# Patient Record
Sex: Male | Born: 1950 | Race: White | Hispanic: No | Marital: Married | Smoking: Current every day smoker
Health system: Southern US, Community
[De-identification: ages and names within clinical notes are randomized; demographics above are authoritative.]

## PROBLEM LIST (undated history)

## (undated) DIAGNOSIS — N529 Male erectile dysfunction, unspecified: Secondary | ICD-10-CM

## (undated) DIAGNOSIS — G2581 Restless legs syndrome: Secondary | ICD-10-CM

## (undated) DIAGNOSIS — F513 Sleepwalking [somnambulism]: Secondary | ICD-10-CM

## (undated) DIAGNOSIS — R319 Hematuria, unspecified: Secondary | ICD-10-CM

## (undated) DIAGNOSIS — G20 Parkinson's disease: Secondary | ICD-10-CM

## (undated) DIAGNOSIS — K219 Gastro-esophageal reflux disease without esophagitis: Secondary | ICD-10-CM

## (undated) DIAGNOSIS — F514 Sleep terrors [night terrors]: Secondary | ICD-10-CM

## (undated) DIAGNOSIS — G4730 Sleep apnea, unspecified: Secondary | ICD-10-CM

## (undated) DIAGNOSIS — G20C Parkinsonism, unspecified: Secondary | ICD-10-CM

## (undated) DIAGNOSIS — F329 Major depressive disorder, single episode, unspecified: Secondary | ICD-10-CM

## (undated) DIAGNOSIS — T8859XA Other complications of anesthesia, initial encounter: Secondary | ICD-10-CM

## (undated) DIAGNOSIS — T4145XA Adverse effect of unspecified anesthetic, initial encounter: Secondary | ICD-10-CM

## (undated) DIAGNOSIS — F3110 Bipolar disorder, current episode manic without psychotic features, unspecified: Secondary | ICD-10-CM

## (undated) DIAGNOSIS — F419 Anxiety disorder, unspecified: Secondary | ICD-10-CM

## (undated) DIAGNOSIS — M1990 Unspecified osteoarthritis, unspecified site: Secondary | ICD-10-CM

## (undated) DIAGNOSIS — F32A Depression, unspecified: Secondary | ICD-10-CM

## (undated) DIAGNOSIS — J449 Chronic obstructive pulmonary disease, unspecified: Secondary | ICD-10-CM

## (undated) DIAGNOSIS — B1920 Unspecified viral hepatitis C without hepatic coma: Secondary | ICD-10-CM

## (undated) HISTORY — DX: Unspecified viral hepatitis C without hepatic coma: B19.20

## (undated) HISTORY — DX: Bipolar disorder, current episode manic without psychotic features, unspecified: F31.10

## (undated) HISTORY — PX: COLONOSCOPY: SHX174

## (undated) HISTORY — DX: Anxiety disorder, unspecified: F41.9

## (undated) HISTORY — DX: Restless legs syndrome: G25.81

## (undated) HISTORY — DX: Sleep terrors (night terrors): F51.4

## (undated) HISTORY — DX: Parkinsonism, unspecified: G20.C

## (undated) HISTORY — DX: Male erectile dysfunction, unspecified: N52.9

## (undated) HISTORY — PX: LIVER BIOPSY: SHX301

## (undated) HISTORY — DX: Major depressive disorder, single episode, unspecified: F32.9

## (undated) HISTORY — DX: Depression, unspecified: F32.A

## (undated) HISTORY — PX: DEEP SHOULDER TUMOR EXCISION: SUR577

## (undated) HISTORY — PX: TONSILLECTOMY: SUR1361

## (undated) HISTORY — DX: Parkinson's disease: G20

## (undated) HISTORY — DX: Sleepwalking (somnambulism): F51.3

## (undated) HISTORY — DX: Sleep apnea, unspecified: G47.30

---

## 2011-03-25 DIAGNOSIS — F419 Anxiety disorder, unspecified: Secondary | ICD-10-CM

## 2011-03-25 DIAGNOSIS — N529 Male erectile dysfunction, unspecified: Secondary | ICD-10-CM

## 2011-03-25 DIAGNOSIS — B1920 Unspecified viral hepatitis C without hepatic coma: Secondary | ICD-10-CM

## 2011-03-25 HISTORY — DX: Anxiety disorder, unspecified: F41.9

## 2011-03-25 HISTORY — DX: Male erectile dysfunction, unspecified: N52.9

## 2011-03-25 HISTORY — DX: Unspecified viral hepatitis C without hepatic coma: B19.20

## 2011-03-25 LAB — BASIC METABOLIC PANEL: Glucose: 105 mg/dL

## 2011-03-25 LAB — CBC AND DIFFERENTIAL
HCT: 45 % (ref 41–53)
Hemoglobin: 15.3 g/dL (ref 13.5–17.5)

## 2011-03-25 LAB — HEPATIC FUNCTION PANEL
ALT: 73 U/L — AB (ref 10–40)
AST: 35 U/L (ref 14–40)
Bilirubin, Total: 0.1 mg/dL

## 2011-03-25 LAB — TSH: TSH: 0.9 u[IU]/mL (ref <=5.90)

## 2011-05-08 ENCOUNTER — Ambulatory Visit (INDEPENDENT_AMBULATORY_CARE_PROVIDER_SITE_OTHER): Payer: Self-pay | Admitting: Internal Medicine

## 2011-06-05 ENCOUNTER — Encounter (INDEPENDENT_AMBULATORY_CARE_PROVIDER_SITE_OTHER): Payer: Self-pay

## 2011-07-01 ENCOUNTER — Encounter (INDEPENDENT_AMBULATORY_CARE_PROVIDER_SITE_OTHER): Payer: Self-pay | Admitting: Internal Medicine

## 2011-07-01 ENCOUNTER — Ambulatory Visit (INDEPENDENT_AMBULATORY_CARE_PROVIDER_SITE_OTHER): Payer: 59 | Admitting: Internal Medicine

## 2011-07-01 DIAGNOSIS — F329 Major depressive disorder, single episode, unspecified: Secondary | ICD-10-CM

## 2011-07-01 DIAGNOSIS — B1920 Unspecified viral hepatitis C without hepatic coma: Secondary | ICD-10-CM

## 2011-07-01 DIAGNOSIS — F319 Bipolar disorder, unspecified: Secondary | ICD-10-CM

## 2011-07-01 LAB — AMMONIA: Ammonia: 18 umol/L (ref 11–60)
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
## 2011-07-02 ENCOUNTER — Telehealth (INDEPENDENT_AMBULATORY_CARE_PROVIDER_SITE_OTHER): Payer: Self-pay | Admitting: Internal Medicine

## 2011-07-02 LAB — PROTIME-INR: Prothrombin Time: 13.8 seconds (ref 11.6–15.2)

## 2011-07-03 LAB — HEPATITIS C RNA QUANTITATIVE: HCV Quantitative Log: 6.61 {Log} — ABNORMAL HIGH (ref <1.63)

## 2011-07-10 ENCOUNTER — Telehealth (INDEPENDENT_AMBULATORY_CARE_PROVIDER_SITE_OTHER): Payer: Self-pay | Admitting: Internal Medicine

## 2011-07-10 NOTE — Telephone Encounter (Signed)
I spoke with wife concerning Michael Fitzgerald.  They are going to discuss whether Michael Fitzgerald will be treated. If they decide they want  Hep C treatment they will let me know and we will make the referral to [facility].

## 2011-07-11 ENCOUNTER — Telehealth (INDEPENDENT_AMBULATORY_CARE_PROVIDER_SITE_OTHER): Payer: Self-pay | Admitting: Internal Medicine

## 2011-07-11 NOTE — Telephone Encounter (Signed)
Results given to patient. He came by the office today.  Would like a referral to [facility].  This   Has been addressed with Luster Landsberg. As of yet I do not have the genotype.  We have received records from Dr. Gwenevere Abbot at [place].

## 2011-07-11 NOTE — Telephone Encounter (Signed)
Patient needs referral to [facility] for Hepatitis C.

## 2011-07-11 NOTE — Telephone Encounter (Signed)
Referral faxed to [facility], their office will contact patient with appt

## 2011-07-11 NOTE — Telephone Encounter (Signed)
No answer at home #

## 2011-07-15 ENCOUNTER — Telehealth (INDEPENDENT_AMBULATORY_CARE_PROVIDER_SITE_OTHER): Payer: Self-pay | Admitting: Internal Medicine

## 2011-07-15 NOTE — Telephone Encounter (Signed)
Message left at home.hh

## 2011-07-17 NOTE — Telephone Encounter (Signed)
[name] , I called the lab, and they said that they had not rec'd the order for Hep C Genotype. It will need to be resent to them and the patient contacted to go and have blood drawn.

## 2011-07-18 ENCOUNTER — Other Ambulatory Visit (INDEPENDENT_AMBULATORY_CARE_PROVIDER_SITE_OTHER): Payer: Self-pay | Admitting: Internal Medicine

## 2011-07-18 NOTE — Telephone Encounter (Signed)
Spoke with Michael Fitzgerald this am. Apologized for genotype not being drawn.  (Lab says they did not get the order for genotype).  He will come by office in the morning to pick up slip for them to draw the blood.   [name]

## 2011-07-29 ENCOUNTER — Telehealth (INDEPENDENT_AMBULATORY_CARE_PROVIDER_SITE_OTHER): Payer: Self-pay | Admitting: Internal Medicine

## 2011-07-29 NOTE — Telephone Encounter (Signed)
Patient will be referred to [facility]

## 2011-11-22 ENCOUNTER — Telehealth (INDEPENDENT_AMBULATORY_CARE_PROVIDER_SITE_OTHER): Payer: Self-pay | Admitting: Internal Medicine

## 2012-01-09 ENCOUNTER — Telehealth (INDEPENDENT_AMBULATORY_CARE_PROVIDER_SITE_OTHER): Payer: Self-pay | Admitting: Internal Medicine

## 2012-01-09 NOTE — Telephone Encounter (Signed)
Close encounter 

## 2012-01-09 NOTE — Telephone Encounter (Signed)
none

## 2015-05-17 ENCOUNTER — Encounter (HOSPITAL_COMMUNITY): Payer: Self-pay

## 2015-05-17 ENCOUNTER — Encounter (HOSPITAL_COMMUNITY)
Admission: RE | Admit: 2015-05-17 | Discharge: 2015-05-17 | Disposition: A | Discharge status: Home / Self Care | Payer: Medicare PPO | Source: Ambulatory Visit | Attending: Otolaryngology | Admitting: Otolaryngology

## 2015-05-17 DIAGNOSIS — Z01812 Encounter for preprocedural laboratory examination: Secondary | ICD-10-CM | POA: Insufficient documentation

## 2015-05-17 DIAGNOSIS — K119 Disease of salivary gland, unspecified: Secondary | ICD-10-CM | POA: Diagnosis not present

## 2015-05-17 HISTORY — DX: Unspecified osteoarthritis, unspecified site: M19.90

## 2015-05-17 HISTORY — DX: Other complications of anesthesia, initial encounter: T88.59XA

## 2015-05-17 HISTORY — DX: Adverse effect of unspecified anesthetic, initial encounter: T41.45XA

## 2015-05-17 HISTORY — DX: Chronic obstructive pulmonary disease, unspecified: J44.9

## 2015-05-17 HISTORY — DX: Hematuria, unspecified: R31.9

## 2015-05-17 HISTORY — DX: Gastro-esophageal reflux disease without esophagitis: K21.9

## 2015-05-17 LAB — COMPREHENSIVE METABOLIC PANEL
ALK PHOS: 72 U/L (ref 38–126)
ALT: 12 U/L — ABNORMAL LOW (ref 17–63)
AST: 16 U/L (ref 15–41)
Albumin: 3.9 g/dL (ref 3.5–5.0)
Anion gap: 7 (ref 5–15)
BILIRUBIN TOTAL: 0.7 mg/dL (ref 0.3–1.2)
BUN: 9 mg/dL (ref 6–20)
CHLORIDE: 107 mmol/L (ref 101–111)
CO2: 25 mmol/L (ref 22–32)
Calcium: 9.2 mg/dL (ref 8.9–10.3)
Creatinine, Ser: 1.11 mg/dL (ref 0.61–1.24)
GFR calc Af Amer: >60 mL/min (ref >60)
Glucose, Bld: 96 mg/dL (ref 65–99)
Potassium: 4.3 mmol/L (ref 3.5–5.1)
Sodium: 139 mmol/L (ref 135–145)
Total Protein: 6.9 g/dL (ref 6.5–8.1)

## 2015-05-17 LAB — CBC
HEMATOCRIT: 45.2 % (ref 39.0–52.0)
Hemoglobin: 15.6 g/dL (ref 13.0–17.0)
MCH: 32.8 pg (ref 26.0–34.0)
MCHC: 34.5 g/dL (ref 30.0–36.0)
MCV: 95 fL (ref 78.0–100.0)
Platelets: 193 10*3/uL (ref 150–400)
RBC: 4.76 MIL/uL (ref 4.22–5.81)
RDW: 13.4 % (ref 11.5–15.5)
WBC: 7.2 10*3/uL (ref 4.0–10.5)

## 2015-05-17 LAB — PROTIME-INR
INR: 1.06 (ref 0.00–1.49)
PROTHROMBIN TIME: 14 s (ref 11.6–15.2)

## 2015-05-17 LAB — APTT: APTT: 29 s (ref 24–37)

## 2015-05-17 NOTE — Pre-Procedure Instructions (Addendum)
Michael Fitzgerald  05/17/2015     No Pharmacies Listed   Your procedure is scheduled on 05/25/15  Report to [facility] short stay admitting at 615 A.M.  Call this number if you have problems the morning of surgery:  [phone]   Remember:  Do not eat food or drink liquids after midnight.  Take these medicines the morning of surgery with A SIP OF WATER   xanax     STOP all herbel meds, nsaids (aleve,naproxen,advil,ibuprofen) 5 days prior to surgery starting 05/20/15 including vitamins,aspirin   Do not wear jewelry, make-up or nail polish.  Do not wear lotions, powders, or perfumes.  You may wear deodorant.  Do not shave 48 hours prior to surgery.  Men may shave face and neck.  Do not bring valuables to the hospital.  [facility] is not responsible for any belongings or valuables.  Contacts, dentures or bridgework may not be worn into surgery.  Leave your suitcase in the car.  After surgery it may be brought to your room.  For patients admitted to the hospital, discharge time will be determined by your treatment team.  Patients discharged the day of surgery will not be allowed to drive home.   Name and phone number of your driver:    Special instructions:   Special Instructions: [place] - Preparing for Surgery  Before surgery, you can play an important role.  Because skin is not sterile, your skin needs to be as free of germs as possible.  You can reduce the number of germs on you skin by washing with CHG (chlorahexidine gluconate) soap before surgery.  CHG is an antiseptic cleaner which kills germs and bonds with the skin to continue killing germs even after washing.  Please DO NOT use if you have an allergy to CHG or antibacterial soaps.  If your skin becomes reddened/irritated stop using the CHG and inform your nurse when you arrive at Short Stay.  Do not shave (including legs and underarms) for at least 48 hours prior to the first CHG shower.  You may shave your  face.  Please follow these instructions carefully:   1.  Shower with CHG Soap the night before surgery and the morning of Surgery.  2.  If you choose to wash your hair, wash your hair first as usual with your normal shampoo.  3.  After you shampoo, rinse your hair and body thoroughly to remove the Shampoo.  4.  Use CHG as you would any other liquid soap.  You can apply chg directly  to the skin and wash gently with scrungie or a clean washcloth.  5.  Apply the CHG Soap to your body ONLY FROM THE NECK DOWN.  Do not use on open wounds or open sores.  Avoid contact with your eyes ears, mouth and genitals (private parts).  Wash genitals (private parts)       with your normal soap.  6.  Wash thoroughly, paying special attention to the area where your surgery will be performed.  7.  Thoroughly rinse your body with warm water from the neck down.  8.  DO NOT shower/wash with your normal soap after using and rinsing off the CHG Soap.  9.  Pat yourself dry with a clean towel.            10.  Wear clean pajamas.            11.  Place clean sheets on your bed the  night of your first shower and do not sleep with pets.  Day of Surgery  Do not apply any lotions/deodorants the morning of surgery.  Please wear clean clothes to the hospital/surgery center.  Please read over the following fact sheets that you were given. Pain Booklet, Coughing and Deep Breathing and Surgical Site Infection Prevention

## 2015-05-18 ENCOUNTER — Other Ambulatory Visit (HOSPITAL_COMMUNITY): Payer: Self-pay | Admitting: Otolaryngology

## 2015-05-18 ENCOUNTER — Other Ambulatory Visit: Payer: Self-pay | Admitting: Otolaryngology

## 2015-05-23 ENCOUNTER — Ambulatory Visit: Payer: Self-pay | Admitting: Otolaryngology

## 2015-05-23 NOTE — H&P (Signed)
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 Discussed  This is a 64 year old male with two month history of left parotid mass. We discussed the significance of this and recommend surgical excision. Risks and beneftis of surgery discussed. All questions and concerns addressed. Consent obtained. We will schedule this at the main hospital. Patient agrees with the plan. Plan  I have interviewed and examined the patient and developed the proposed treatment plan.  Michael Fitzgerald H. Constance Holster, M.D. Reason For Visit  Michael Fitzgerald is here today at the kind request of Michael Fitzgerald for consultation and opinion for parotid mass. HPI  Patient is a 64 year old male who presents for left parotid mass. He states that he noticed a fullness along the left angle of the jaw about two months ago while shaving. It seems to be slowly increasing in size but otherwise is not bothering him. He had an ultrasound on 04/28/15 which showed a complex 3.1cm left parotid masss. Denies fever, pain, difficulty swallowing, difficulty breathing, facial weakness/numbness or ear pain.  Patient has sleep apnea. Uses Albuterol inhaler a few times per week if he is working outdoors but does not have a formal diagnosis of asthma or emphysema/COPD (has never seen a pulmonary specialist). No history of skin cancer of the head/neck. Surgeries include tonsillectomy. He also had a benign tumor removed from the right upper back. He is a current smoker; less than one pack per day. He is retired from [employer] work. Wears hearing aids, managed by an outside provider. Allergies  Rec: 20Jun2016.  List Reconciled and Reviewed. Latex predniSONE Amended : Michael Sizer  LPN; 16/08/9603 5:40 PM EST. Current Meds  ALPRAZolam 1 MG Oral Tablet;; RPT. Active Problems  Obstructive sleep apnea   (327.23) (G47.33). PMH  History of hepatitis (V12.09) (Z86.19). PSH  Shoulder Surgery; growth on shoulder Tonsillectomy. Family Hx   Seizure: Mother (R56.9). Personal Hx  Current smoker (305.1) (F17.200) No alcohol use No caffeine use. ROS  Systemic: Feeling tired (fatigue).  No fever  and no night sweats.  Recent weight loss. Head: Headache. Eyes: No eye symptoms. Otolaryngeal: Hearing loss.  No earache.  Tinnitus.  No purulent nasal discharge.  No nasal passage blockage (stuffiness).  Snoring.  No sneezing, no hoarseness, and no sore throat. Cardiovascular: No chest pain or discomfort  and no palpitations. Pulmonary: Dyspnea  and cough.  No wheezing. Gastrointestinal: No dysphagia.  Heartburn.  No nausea, no abdominal pain, and no melena.  No diarrhea. Genitourinary: No dysuria. Endocrine: No muscle weakness. Musculoskeletal: Calf muscle cramps  and arthralgias.  No soft tissue swelling. Neurological: Dizziness.  No fainting.  Tingling.  No numbness. Psychological: Anxiety  and depression. Skin: No rash. 12 system ROS was obtained and reviewed on the Health Maintenance form dated today.  Positive responses are shown above.  If the symptom is not checked, the patient has denied it. Vital Signs   Recorded by Michael Fitzgerald on 08 May 2015 12:55 PM BP:120/80,  BSA Calculated: 1.96 ,  BMI Calculated: 27.67 ,  Weight: 182 lb , BMI: 27.7 kg/m2,  Height: 68 in. Physical Exam  APPEARANCE: Well developed, well nourished, in no acute distress.  Normal affect, in a pleasant mood.  Oriented to time, place and person. COMMUNICATION: Normal voice   HEAD & FACE:  No scars, lesions or masses of head and face.  Sinuses nontender to palpation.  Left parotid gland with 3cm rubbery well-circumscribed mass, non-tender.  Facial strength symmetric.  No facial weakness or paralysis. EYES: EOMI with normal primary gaze alignment. Visual acuity grossly intact.  PERRLA EXTERNAL EAR & NOSE: No scars, lesions or masses  EAC & TYMPANIC MEMBRANE:  EAC shows no obstructing lesions or debris and tympanic membranes are normal bilaterally.  No middle ear effusion, infection or cholesteatoma. GROSS HEARING: Mildly impaired without use of hearing aids. TMJ:  Nontender  INTRANASAL EXAM: No polyps or purulence.  NASOPHARYNX: Normal, without lesions. LIPS, TEETH & GUMS: No lip lesions, normal dentition and normal gums. ORAL CAVITY/OROPHARYNX:  Oral mucosa moist without lesion or asymmetry of the palate, tongue or posterior pharynx. Right tonsil 2+, left tonsil surgically absent. NECK:  Supple without adenopathy or mass. THYROID:  Normal with no masses palpable.  NEUROLOGIC:  No gross CN deficits. No nystagmus noted.   LYMPHATIC:  No enlarged nodes palpable. Signature  Electronically signed by : Jolene Provost  PA-C; 05/08/2015 1:29 PM EST. Electronically signed by : Izora Gala  M.D.; 05/08/2015 1:32 PM EST. Electronically signed by : Izora Gala  M.D.; 05/08/2015 1:36 PM EST.

## 2015-05-24 MED ORDER — CEFAZOLIN SODIUM-DEXTROSE 2-3 GM-% IV SOLR
2.0000 g | INTRAVENOUS | Status: AC
  Administered 2015-05-25: 2 g via INTRAVENOUS
  Filled 2015-05-24: qty 50

## 2015-05-25 ENCOUNTER — Encounter (HOSPITAL_COMMUNITY)
Admission: RE | Disposition: A | Discharge status: Home / Self Care | Payer: Self-pay | Source: Ambulatory Visit | Attending: Otolaryngology

## 2015-05-25 ENCOUNTER — Observation Stay (HOSPITAL_COMMUNITY)
Admission: RE | Admit: 2015-05-25 | Discharge: 2015-05-26 | Disposition: A | Discharge status: Home / Self Care | Payer: Medicare PPO | Source: Ambulatory Visit | Attending: Otolaryngology | Admitting: Otolaryngology

## 2015-05-25 ENCOUNTER — Encounter (HOSPITAL_COMMUNITY): Payer: Self-pay | Admitting: *Deleted

## 2015-05-25 ENCOUNTER — Ambulatory Visit (HOSPITAL_COMMUNITY): Payer: Medicare PPO | Admitting: Anesthesiology

## 2015-05-25 DIAGNOSIS — K219 Gastro-esophageal reflux disease without esophagitis: Secondary | ICD-10-CM | POA: Diagnosis not present

## 2015-05-25 DIAGNOSIS — R221 Localized swelling, mass and lump, neck: Secondary | ICD-10-CM | POA: Diagnosis present

## 2015-05-25 DIAGNOSIS — J449 Chronic obstructive pulmonary disease, unspecified: Secondary | ICD-10-CM | POA: Insufficient documentation

## 2015-05-25 DIAGNOSIS — F17210 Nicotine dependence, cigarettes, uncomplicated: Secondary | ICD-10-CM | POA: Diagnosis not present

## 2015-05-25 DIAGNOSIS — Z91040 Latex allergy status: Secondary | ICD-10-CM | POA: Insufficient documentation

## 2015-05-25 DIAGNOSIS — D110 Benign neoplasm of parotid gland: Principal | ICD-10-CM | POA: Insufficient documentation

## 2015-05-25 DIAGNOSIS — G4730 Sleep apnea, unspecified: Secondary | ICD-10-CM | POA: Insufficient documentation

## 2015-05-25 DIAGNOSIS — Z888 Allergy status to other drugs, medicaments and biological substances status: Secondary | ICD-10-CM | POA: Diagnosis not present

## 2015-05-25 DIAGNOSIS — K118 Other diseases of salivary glands: Secondary | ICD-10-CM | POA: Diagnosis present

## 2015-05-25 HISTORY — PX: PAROTIDECTOMY: SUR1003

## 2015-05-25 HISTORY — PX: PAROTIDECTOMY: SHX2163

## 2015-05-25 SURGERY — EXCISION, PAROTID GLAND
Anesthesia: General | Site: Neck | Laterality: Left

## 2015-05-25 MED ORDER — ALBUTEROL SULFATE HFA 108 (90 BASE) MCG/ACT IN AERS: 1.0000 | INHALATION_SPRAY | Freq: Four times a day (QID) | RESPIRATORY_TRACT | Status: DC | PRN

## 2015-05-25 MED ORDER — PHENYLEPHRINE HCL 10 MG/ML IJ SOLN
INTRAMUSCULAR | Status: DC | PRN
  Administered 2015-05-25: 80 ug via INTRAVENOUS

## 2015-05-25 MED ORDER — HYDROMORPHONE HCL 1 MG/ML IJ SOLN
0.2500 mg | INTRAMUSCULAR | Status: DC | PRN
  Administered 2015-05-25: 0.5 mg via INTRAVENOUS

## 2015-05-25 MED ORDER — FENTANYL CITRATE (PF) 250 MCG/5ML IJ SOLN
INTRAMUSCULAR | Status: AC
  Filled 2015-05-25: qty 5

## 2015-05-25 MED ORDER — DEXTROSE-NACL 5-0.9 % IV SOLN
INTRAVENOUS | Status: DC
  Administered 2015-05-25: 12:00:00 via INTRAVENOUS

## 2015-05-25 MED ORDER — HYDROCODONE-ACETAMINOPHEN 7.5-325 MG PO TABS: 1.0000 | ORAL_TABLET | Freq: Four times a day (QID) | ORAL | Status: DC | PRN

## 2015-05-25 MED ORDER — LACTATED RINGERS IV SOLN
INTRAVENOUS | Status: DC
  Administered 2015-05-25 (×2): via INTRAVENOUS

## 2015-05-25 MED ORDER — BACITRACIN ZINC 500 UNIT/GM EX OINT
TOPICAL_OINTMENT | CUTANEOUS | Status: AC
  Filled 2015-05-25: qty 28.35

## 2015-05-25 MED ORDER — PROMETHAZINE HCL 25 MG PO TABS: 25.0000 mg | ORAL_TABLET | Freq: Four times a day (QID) | ORAL | Status: DC | PRN

## 2015-05-25 MED ORDER — 0.9 % SODIUM CHLORIDE (POUR BTL) OPTIME
TOPICAL | Status: DC | PRN
  Administered 2015-05-25: 1000 mL

## 2015-05-25 MED ORDER — POLYVINYL ALCOHOL 1.4 % OP SOLN
1.0000 [drp] | OPHTHALMIC | Status: DC
  Administered 2015-05-25 – 2015-05-26 (×8): 1 [drp] via OPHTHALMIC
  Filled 2015-05-25: qty 15

## 2015-05-25 MED ORDER — ARTIFICIAL TEARS OP OINT
TOPICAL_OINTMENT | Freq: Every day | OPHTHALMIC | Status: DC
  Administered 2015-05-25: 22:00:00 via OPHTHALMIC
  Filled 2015-05-25: qty 3.5

## 2015-05-25 MED ORDER — PROMETHAZINE HCL 25 MG/ML IJ SOLN
6.2500 mg | INTRAMUSCULAR | Status: DC | PRN
Start: 2015-05-25 — End: 2015-05-25

## 2015-05-25 MED ORDER — PROMETHAZINE HCL 25 MG RE SUPP: 25.0000 mg | Freq: Four times a day (QID) | RECTAL | Status: DC | PRN

## 2015-05-25 MED ORDER — ALPRAZOLAM 0.5 MG PO TABS
0.5000 mg | ORAL_TABLET | Freq: Every evening | ORAL | Status: DC | PRN
  Administered 2015-05-25: 0.5 mg via ORAL
  Filled 2015-05-25: qty 1

## 2015-05-25 MED ORDER — GLYCOPYRROLATE 0.2 MG/ML IJ SOLN
INTRAMUSCULAR | Status: AC
  Filled 2015-05-25: qty 2

## 2015-05-25 MED ORDER — LIDOCAINE-EPINEPHRINE 1 %-1:100000 IJ SOLN
INTRAMUSCULAR | Status: DC | PRN
  Administered 2015-05-25: 20 mL

## 2015-05-25 MED ORDER — LIDOCAINE-EPINEPHRINE 1 %-1:100000 IJ SOLN
INTRAMUSCULAR | Status: AC
  Filled 2015-05-25: qty 1

## 2015-05-25 MED ORDER — HYDROCODONE-ACETAMINOPHEN 5-325 MG PO TABS
1.0000 | ORAL_TABLET | ORAL | Status: DC | PRN
  Administered 2015-05-25: 2 via ORAL
  Filled 2015-05-25: qty 2

## 2015-05-25 MED ORDER — GLYCOPYRROLATE 0.2 MG/ML IJ SOLN
INTRAMUSCULAR | Status: DC | PRN
  Administered 2015-05-25: .6 mg via INTRAVENOUS

## 2015-05-25 MED ORDER — LIDOCAINE HCL (CARDIAC) 20 MG/ML IV SOLN
INTRAVENOUS | Status: DC | PRN
  Administered 2015-05-25: 100 mg via INTRAVENOUS

## 2015-05-25 MED ORDER — ROCURONIUM BROMIDE 100 MG/10ML IV SOLN
INTRAVENOUS | Status: DC | PRN
  Administered 2015-05-25: 10 mg via INTRAVENOUS
  Administered 2015-05-25: 50 mg via INTRAVENOUS

## 2015-05-25 MED ORDER — PROPOFOL 10 MG/ML IV BOLUS
INTRAVENOUS | Status: AC
  Filled 2015-05-25: qty 20

## 2015-05-25 MED ORDER — ONDANSETRON HCL 4 MG/2ML IJ SOLN
INTRAMUSCULAR | Status: DC | PRN
  Administered 2015-05-25: 4 mg via INTRAVENOUS

## 2015-05-25 MED ORDER — HYDROMORPHONE HCL 1 MG/ML IJ SOLN
INTRAMUSCULAR | Status: AC
  Filled 2015-05-25: qty 1

## 2015-05-25 MED ORDER — IBUPROFEN 200 MG PO TABS: 200.0000 mg | ORAL_TABLET | Freq: Four times a day (QID) | ORAL | Status: DC | PRN

## 2015-05-25 MED ORDER — NEOSTIGMINE METHYLSULFATE 10 MG/10ML IV SOLN
INTRAVENOUS | Status: AC
  Filled 2015-05-25: qty 1

## 2015-05-25 MED ORDER — MIDAZOLAM HCL 2 MG/2ML IJ SOLN
INTRAMUSCULAR | Status: AC
  Filled 2015-05-25: qty 2

## 2015-05-25 MED ORDER — ALBUTEROL SULFATE (2.5 MG/3ML) 0.083% IN NEBU: 2.5000 mg | INHALATION_SOLUTION | Freq: Four times a day (QID) | RESPIRATORY_TRACT | Status: DC | PRN

## 2015-05-25 MED ORDER — ROCURONIUM BROMIDE 50 MG/5ML IV SOLN
INTRAVENOUS | Status: AC
  Filled 2015-05-25: qty 2

## 2015-05-25 MED ORDER — MIDAZOLAM HCL 5 MG/5ML IJ SOLN
INTRAMUSCULAR | Status: DC | PRN
  Administered 2015-05-25: 2 mg via INTRAVENOUS

## 2015-05-25 MED ORDER — FENTANYL CITRATE (PF) 100 MCG/2ML IJ SOLN
INTRAMUSCULAR | Status: DC | PRN
  Administered 2015-05-25 (×5): 50 ug via INTRAVENOUS

## 2015-05-25 MED ORDER — NEOSTIGMINE METHYLSULFATE 10 MG/10ML IV SOLN
INTRAVENOUS | Status: DC | PRN
  Administered 2015-05-25: 4 mg via INTRAVENOUS

## 2015-05-25 MED ORDER — ONDANSETRON HCL 4 MG/2ML IJ SOLN
INTRAMUSCULAR | Status: AC
  Filled 2015-05-25: qty 2

## 2015-05-25 MED ORDER — PROPOFOL 10 MG/ML IV BOLUS
INTRAVENOUS | Status: DC | PRN
Start: 2015-05-25 — End: 2015-05-25
  Administered 2015-05-25: 180 mg via INTRAVENOUS

## 2015-05-25 SURGICAL SUPPLY — 44 items
ATTRACTOMAT 16X20 MAGNETIC DRP (DRAPES) IMPLANT
BLADE SURG 15 STRL LF DISP TIS (BLADE) IMPLANT
BLADE SURG 15 STRL SS (BLADE)
BLADE SURG ROTATE 9660 (MISCELLANEOUS) IMPLANT
CANISTER SUCTION 2500CC (MISCELLANEOUS) ×3 IMPLANT
CLEANER TIP ELECTROSURG 2X2 (MISCELLANEOUS) ×3 IMPLANT
CONT SPEC 4OZ CLIKSEAL STRL BL (MISCELLANEOUS) IMPLANT
CORDS BIPOLAR (ELECTRODE) ×3 IMPLANT
COVER SURGICAL LIGHT HANDLE (MISCELLANEOUS) ×3 IMPLANT
DERMABOND ADVANCED (GAUZE/BANDAGES/DRESSINGS) ×2
DERMABOND ADVANCED .7 DNX12 (GAUZE/BANDAGES/DRESSINGS) ×1 IMPLANT
DRAIN SNY 7 FPER (WOUND CARE) ×3 IMPLANT
DRAPE INCISE 23X17 IOBAN STRL (DRAPES) ×2
DRAPE INCISE IOBAN 23X17 STRL (DRAPES) ×1 IMPLANT
DRAPE PROXIMA HALF (DRAPES) IMPLANT
ELECT COATED BLADE 2.86 ST (ELECTRODE) ×3 IMPLANT
ELECT REM PT RETURN 9FT ADLT (ELECTROSURGICAL) ×3
ELECTRODE REM PT RTRN 9FT ADLT (ELECTROSURGICAL) ×1 IMPLANT
EVACUATOR SILICONE 100CC (DRAIN) ×3 IMPLANT
FORCEPS BIPOLAR SPETZLER 8 1.0 (NEUROSURGERY SUPPLIES) ×3 IMPLANT
GAUZE SPONGE 4X4 16PLY XRAY LF (GAUZE/BANDAGES/DRESSINGS) ×3 IMPLANT
GLOVE SURG SS PI 6.5 STRL IVOR (GLOVE) ×9 IMPLANT
GLOVE SURG SS PI 7.0 STRL IVOR (GLOVE) ×3 IMPLANT
GLOVE SURG SS PI 7.5 STRL IVOR (GLOVE) ×6 IMPLANT
GLOVE SURG SS PI 8.0 STRL IVOR (GLOVE) ×3 IMPLANT
GOWN STRL REUS W/ TWL LRG LVL3 (GOWN DISPOSABLE) ×2 IMPLANT
GOWN STRL REUS W/TWL LRG LVL3 (GOWN DISPOSABLE) ×4
KIT BASIN OR (CUSTOM PROCEDURE TRAY) ×3 IMPLANT
KIT ROOM TURNOVER OR (KITS) ×3 IMPLANT
LOCATOR NERVE 3 VOLT (DISPOSABLE) ×3 IMPLANT
NEEDLE 27GAX1X1/2 (NEEDLE) IMPLANT
NS IRRIG 1000ML POUR BTL (IV SOLUTION) ×3 IMPLANT
PAD ARMBOARD 7.5X6 YLW CONV (MISCELLANEOUS) ×6 IMPLANT
PENCIL FOOT CONTROL (ELECTRODE) ×3 IMPLANT
SHEARS HARMONIC 9CM CVD (BLADE) ×3 IMPLANT
STAPLER VISISTAT 35W (STAPLE) ×3 IMPLANT
SUT CHROMIC 4 0 PS 2 18 (SUTURE) ×3 IMPLANT
SUT ETHILON 5 0 P 3 18 (SUTURE) ×2
SUT NYLON ETHILON 5-0 P-3 1X18 (SUTURE) ×1 IMPLANT
SUT SILK 2 0 SH CR/8 (SUTURE) ×3 IMPLANT
SUT SILK 4 0 REEL (SUTURE) ×3 IMPLANT
SYR CONTROL 10ML LL (SYRINGE) IMPLANT
TOWEL OR 17X24 6PK STRL BLUE (TOWEL DISPOSABLE) ×3 IMPLANT
TRAY ENT MC OR (CUSTOM PROCEDURE TRAY) ×3 IMPLANT

## 2015-05-25 NOTE — Op Note (Signed)
OPERATIVE REPORT  DATE OF SURGERY: 05/25/2015  PATIENT:  Michael Fitzgerald,  64 y.o. male  PRE-OPERATIVE DIAGNOSIS:  LEFT PAROTID MASS  POST-OPERATIVE DIAGNOSIS:  LEFT PAROTID MASS  PROCEDURE:  Procedure(s): LEFT PAROTIDECTOMY  SURGEON:  Beckie Salts, MD  ASSISTANTS: Jolene Provost PA  ANESTHESIA:   General   EBL:  20 ml  DRAINS: 7 French round J-P  LOCAL MEDICATIONS USED:  None  SPECIMEN:  Left parotid  COUNTS:  Correct  PROCEDURE DETAILS: The patient was taken to the operating room and placed on the operating table in the supine position. Following induction of general endotracheal anesthesia, the left face was prepped and draped in a standard fashion. A marking pen was used to outline the incision in the preauricular crease with extension around the mastoid. Electrocautery was used to incise the skin and subcutaneous tissue. Skin flap was developed anteriorly. The earlobe branch of the greater auricular nerve was identified and dissected and reflected posteriorly. The parotid gland was dissected forward off the sternocleidomastoid muscle and off the ear canal. The posterior belly of the digastric muscle was identified. The main trunk of the facial nerve was then identified. Using [facility], the nerve was dissected towards the Pes and then all of the upper and lower branches. Neck dissector was used to divide the parotid tissue overlying the nerve. The tumor was identified and was kept intact. The specimen was delivered and sent for pathologic evaluation. The facial nerve and all its branches were intact visually. A single 4-0 silk tie was used on the retromandibular vein. The wound was irrigated with saline. Hemostasis was completed with bipolar cautery. The drain was exited through a separate stab incision and secured in place with suture. A running subcuticular 4-0 chromic closure was accomplished and Dermabond was used on the skin. The patient was awakened, extubated and  transferred to recovery in stable condition.    PATIENT DISPOSITION:  To PACU, stable

## 2015-05-25 NOTE — Discharge Instructions (Signed)
It is okay to shower and use soap and water but do not use any cream, oil, or ointment on the incision.

## 2015-05-25 NOTE — Progress Notes (Signed)
Patient ID: Michael Fitzgerald, male   DOB: September 11, 1951, 64 y.o.   MRN: [id number] Doing fine.  No complaints. AF VSS Alert, NAD.  Drain functioning. Left parotid incision clean and intact with no fluid collection. Left facial movement weak in all divisions, poor eye closure. 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 Drain overnight.

## 2015-05-25 NOTE — Transfer of Care (Signed)
Immediate Anesthesia Transfer of Care Note  Patient: Michael Fitzgerald  Procedure(s) Performed: Procedure(s): LEFT PAROTIDECTOMY (Left)  Patient Location: PACU  Anesthesia Type:General  Level of Consciousness: awake, alert  and oriented  Airway & Oxygen Therapy: Patient Spontanous Breathing and Patient connected to nasal cannula oxygen  Post-op Assessment: Report given to RN and Post -op Vital signs reviewed and stable  Post vital signs: Reviewed and stable  Last Vitals:  Filed Vitals:   05/25/15 1008  BP:   Pulse:   Temp: 36.4 C  Resp:     Complications: No apparent anesthesia complications

## 2015-05-25 NOTE — Anesthesia Procedure Notes (Signed)
Procedure Name: Intubation Date/Time: 05/25/2015 8:39 AM Performed by: Clearnce Sorrel Pre-anesthesia Checklist: Patient identified, Emergency Drugs available, Suction available, Patient being monitored and Timeout performed Patient Re-evaluated:Patient Re-evaluated prior to inductionOxygen Delivery Method: Circle system utilized Preoxygenation: Pre-oxygenation with 100% oxygen Intubation Type: IV induction Ventilation: Mask ventilation without difficulty Laryngoscope Size: Mac and 3 Grade View: Grade I Tube type: Oral Tube size: 7.5 mm Number of attempts: 1 Airway Equipment and Method: Stylet Placement Confirmation: ETT inserted through vocal cords under direct vision,  positive ETCO2 and breath sounds checked- equal and bilateral Secured at: 23 cm Tube secured with: Tape Dental Injury: Teeth and Oropharynx as per pre-operative assessment  Comments: AOI by A Holtzman SRNA

## 2015-05-25 NOTE — Anesthesia Preprocedure Evaluation (Addendum)
Anesthesia Evaluation  Patient identified by MRN, date of birth, ID band Patient awake    Airway Mallampati: II  TM Distance: >3 FB Neck ROM: Full    Dental  (+) Dental Advisory Given, Caps   Pulmonary sleep apnea , COPDCurrent Smoker,  breath sounds clear to auscultation        Cardiovascular Rhythm:Regular Rate:Normal     Neuro/Psych    GI/Hepatic GERD-  ,(+) Hepatitis -  Endo/Other    Renal/GU      Musculoskeletal   Abdominal   Peds  Hematology   Anesthesia Other Findings   Reproductive/Obstetrics                           Anesthesia Physical Anesthesia Plan  ASA: III  Anesthesia Plan: General   Post-op Pain Management:    Induction: Intravenous  Airway Management Planned: Oral ETT  Additional Equipment:   Intra-op Plan:   Post-operative Plan:   Informed Consent: I have reviewed the patients History and Physical, chart, labs and discussed the procedure including the risks, benefits and alternatives for the proposed anesthesia with the patient or authorized representative who has indicated his/her understanding and acceptance.   Dental advisory given  Plan Discussed with:   Anesthesia Plan Comments:        Anesthesia Quick Evaluation

## 2015-05-25 NOTE — H&P (View-Only) (Signed)
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 Discussed  This is a 64 year old male with two month history of left parotid mass. We discussed the significance of this and recommend surgical excision. Risks and beneftis of surgery discussed. All questions and concerns addressed. Consent obtained. We will schedule this at the main hospital. Patient agrees with the plan. Plan  I have interviewed and examined the patient and developed the proposed treatment plan.  [name] H. Constance Holster, M.D. Reason For Visit  Michael Fitzgerald is here today at the kind request of Michael Fitzgerald for consultation and opinion for parotid mass. HPI  Patient is a 64 year old male who presents for left parotid mass. He states that he noticed a fullness along the left angle of the jaw about two months ago while shaving. It seems to be slowly increasing in size but otherwise is not bothering him. He had an ultrasound on 04/28/15 which showed a complex 3.1cm left parotid masss. Denies fever, pain, difficulty swallowing, difficulty breathing, facial weakness/numbness or ear pain.  Patient has sleep apnea. Uses Albuterol inhaler a few times per week if he is working outdoors but does not have a formal diagnosis of asthma or emphysema/COPD (has never seen a pulmonary specialist). No history of skin cancer of the head/neck. Surgeries include tonsillectomy. He also had a benign tumor removed from the right upper back. He is a current smoker; less than one pack per day. He is retired from [employer] work. Wears hearing aids, managed by an outside provider. Allergies  Rec: 20Jun2016.  List Reconciled and Reviewed. Latex predniSONE Amended : Iran Sizer  LPN; 71/04/2693 8:54 PM EST. Current Meds  ALPRAZolam 1 MG Oral Tablet;; RPT. Active Problems  Obstructive sleep apnea   (327.23) (G47.33). PMH  History of hepatitis (V12.09) (Z86.19). PSH  Shoulder Surgery; growth on shoulder Tonsillectomy. Family Hx   Seizure: Mother (R56.9). Personal Hx  Current smoker (305.1) (F17.200) No alcohol use No caffeine use. ROS  Systemic: Feeling tired (fatigue).  No fever  and no night sweats.  Recent weight loss. Head: Headache. Eyes: No eye symptoms. Otolaryngeal: Hearing loss.  No earache.  Tinnitus.  No purulent nasal discharge.  No nasal passage blockage (stuffiness).  Snoring.  No sneezing, no hoarseness, and no sore throat. Cardiovascular: No chest pain or discomfort  and no palpitations. Pulmonary: Dyspnea  and cough.  No wheezing. Gastrointestinal: No dysphagia.  Heartburn.  No nausea, no abdominal pain, and no melena.  No diarrhea. Genitourinary: No dysuria. Endocrine: No muscle weakness. Musculoskeletal: Calf muscle cramps  and arthralgias.  No soft tissue swelling. Neurological: Dizziness.  No fainting.  Tingling.  No numbness. Psychological: Anxiety  and depression. Skin: No rash. 12 system ROS was obtained and reviewed on the Health Maintenance form dated today.  Positive responses are shown above.  If the symptom is not checked, the patient has denied it. Vital Signs   Recorded by Iran Sizer on 08 May 2015 12:55 PM BP:120/80,  BSA Calculated: 1.96 ,  BMI Calculated: 27.67 ,  Weight: 182 lb , BMI: 27.7 kg/m2,  Height: 68 in. Physical Exam  APPEARANCE: Well developed, well nourished, in no acute distress.  Normal affect, in a pleasant mood.  Oriented to time, place and person. COMMUNICATION: Normal voice   HEAD & FACE:  No scars, lesions or masses of head and face.  Sinuses nontender to palpation.  Left parotid gland with 3cm rubbery well-circumscribed mass, non-tender.  Facial strength symmetric.  No facial weakness or paralysis. EYES: EOMI with normal primary gaze alignment. Visual acuity grossly intact.  PERRLA EXTERNAL EAR & NOSE: No scars, lesions or masses  EAC & TYMPANIC MEMBRANE:  EAC shows no obstructing lesions or debris and tympanic membranes are normal bilaterally.  No middle ear effusion, infection or cholesteatoma. GROSS HEARING: Mildly impaired without use of hearing aids. TMJ:  Nontender  INTRANASAL EXAM: No polyps or purulence.  NASOPHARYNX: Normal, without lesions. LIPS, TEETH & GUMS: No lip lesions, normal dentition and normal gums. ORAL CAVITY/OROPHARYNX:  Oral mucosa moist without lesion or asymmetry of the palate, tongue or posterior pharynx. Right tonsil 2+, left tonsil surgically absent. NECK:  Supple without adenopathy or mass. THYROID:  Normal with no masses palpable.  NEUROLOGIC:  No gross CN deficits. No nystagmus noted.   LYMPHATIC:  No enlarged nodes palpable. Signature  Electronically signed by : Jolene Provost  PA-C; 05/08/2015 1:29 PM EST. Electronically signed by : Izora Gala  M.D.; 05/08/2015 1:32 PM EST. Electronically signed by : Izora Gala  M.D.; 05/08/2015 1:36 PM EST.

## 2015-05-25 NOTE — Anesthesia Postprocedure Evaluation (Signed)
  Anesthesia Post-op Note  Patient: Michael Fitzgerald  Procedure(s) Performed: Procedure(s): LEFT PAROTIDECTOMY (Left)  Patient Location: PACU  Anesthesia Type:General  Level of Consciousness: awake and alert   Airway and Oxygen Therapy: Patient Spontanous Breathing  Post-op Pain: mild  Post-op Assessment: Post-op Vital signs reviewed              Post-op Vital Signs: stable  Last Vitals:  Filed Vitals:   05/25/15 1045  BP:   Pulse:   Temp: 36.1 C  Resp:     Complications: No apparent anesthesia complications

## 2015-05-25 NOTE — Interval H&P Note (Signed)
History and Physical Interval Note:  05/25/2015 7:48 AM  Michael Fitzgerald  has presented today for surgery, with the diagnosis of LEFT PAROTID MASS  The various methods of treatment have been discussed with the patient and family. After consideration of risks, benefits and other options for treatment, the patient has consented to  Procedure(s): LEFT PAROTIDECTOMY (Left) as a surgical intervention .  The patient's history has been reviewed, patient examined, no change in status, stable for surgery.  I have reviewed the patient's chart and labs.  Questions were answered to the patient's satisfaction.     Kristyanna Barcelo

## 2015-05-26 ENCOUNTER — Encounter (HOSPITAL_COMMUNITY): Payer: Self-pay | Admitting: Otolaryngology

## 2015-05-26 NOTE — Discharge Summary (Signed)
  Physician Discharge Summary  Patient ID: Michael Fitzgerald MRN: [id number] DOB/AGE: 64-Jul-1952 64 y.o.  Admit date: 05/25/2015 Discharge date: 05/26/2015  Admission Diagnoses:Parotid mass  Discharge Diagnoses:  Active Problems:   Parotid mass   Discharged Condition: good  Hospital Course: no complications  Consults: none  Significant Diagnostic Studies: none  Treatments: surgery: parotidectomy  Discharge Exam: Blood pressure 127/77, pulse 72, temperature 98.7 F (37.1 C), temperature source Oral, resp. rate 17, height 5\' 10"  (1.778 m), weight 82.101 kg (181 lb), SpO2 98 %. PHYSICAL EXAM: Incision excellent, JP removed. Upper division very weak, lower not as much.  Disposition: Final discharge disposition not confirmed     Medication List    TAKE these medications        albuterol 108 (90 BASE) MCG/ACT inhaler  Commonly known as:  PROVENTIL HFA;VENTOLIN HFA  Inhale into the lungs every 6 (six) hours as needed for wheezing or shortness of breath.     ALPRAZolam 0.5 MG tablet  Commonly known as:  XANAX  Take 0.5 mg by mouth at bedtime as needed. For sleep     HYDROcodone-acetaminophen 7.5-325 MG per tablet  Commonly known as:  NORCO  Take 1 tablet by mouth every 6 (six) hours as needed for moderate pain.     ibuprofen 200 MG tablet  Commonly known as:  ADVIL,MOTRIN  Take 200 mg by mouth every 6 (six) hours as needed for moderate pain.     promethazine 25 MG suppository  Commonly known as:  PHENERGAN  Place 1 suppository (25 mg total) rectally every 6 (six) hours as needed for nausea or vomiting.           Follow-up Information    Follow up with Izora Gala, MD. Schedule an appointment as soon as possible for a visit in 1 week.   Specialty:  Otolaryngology   Contact information:   [address] [place] [address] [phone]       Signed: Izora Gala 05/26/2015, 10:11 AM

## 2015-05-26 NOTE — Progress Notes (Signed)
Pt discharged to home and ambulated out of hospital on his own.  Discharge instructions explained to the pt.  Pt has no questions at the time of discharge.  IV dc'd.  Pt states he has all belongings.

## 2017-08-27 ENCOUNTER — Ambulatory Visit (INDEPENDENT_AMBULATORY_CARE_PROVIDER_SITE_OTHER): Payer: Medicare PPO | Admitting: Urology

## 2017-08-27 ENCOUNTER — Ambulatory Visit (HOSPITAL_COMMUNITY)
Admission: RE | Admit: 2017-08-27 | Discharge: 2017-08-27 | Disposition: A | Discharge status: Home / Self Care | Payer: Medicare PPO | Source: Ambulatory Visit | Attending: Urology | Admitting: Urology

## 2017-08-27 ENCOUNTER — Other Ambulatory Visit: Payer: Self-pay | Admitting: Urology

## 2017-08-27 DIAGNOSIS — N503 Cyst of epididymis: Secondary | ICD-10-CM | POA: Insufficient documentation

## 2017-08-27 DIAGNOSIS — N5201 Erectile dysfunction due to arterial insufficiency: Secondary | ICD-10-CM | POA: Diagnosis not present

## 2017-08-27 DIAGNOSIS — N50812 Left testicular pain: Secondary | ICD-10-CM

## 2017-11-27 DIAGNOSIS — M4714 Other spondylosis with myelopathy, thoracic region: Secondary | ICD-10-CM | POA: Diagnosis not present

## 2017-11-27 DIAGNOSIS — M47816 Spondylosis without myelopathy or radiculopathy, lumbar region: Secondary | ICD-10-CM | POA: Diagnosis not present

## 2017-11-27 DIAGNOSIS — M545 Low back pain: Secondary | ICD-10-CM | POA: Diagnosis not present

## 2017-11-27 DIAGNOSIS — G8929 Other chronic pain: Secondary | ICD-10-CM | POA: Diagnosis not present

## 2017-12-03 DIAGNOSIS — R51 Headache: Secondary | ICD-10-CM | POA: Diagnosis not present

## 2017-12-03 DIAGNOSIS — G319 Degenerative disease of nervous system, unspecified: Secondary | ICD-10-CM | POA: Diagnosis not present

## 2017-12-03 DIAGNOSIS — Z6827 Body mass index (BMI) 27.0-27.9, adult: Secondary | ICD-10-CM | POA: Diagnosis not present

## 2017-12-03 DIAGNOSIS — M4714 Other spondylosis with myelopathy, thoracic region: Secondary | ICD-10-CM | POA: Diagnosis not present

## 2017-12-03 DIAGNOSIS — R030 Elevated blood-pressure reading, without diagnosis of hypertension: Secondary | ICD-10-CM | POA: Diagnosis not present

## 2017-12-03 DIAGNOSIS — M47816 Spondylosis without myelopathy or radiculopathy, lumbar region: Secondary | ICD-10-CM | POA: Diagnosis not present

## 2017-12-24 DIAGNOSIS — M5126 Other intervertebral disc displacement, lumbar region: Secondary | ICD-10-CM | POA: Diagnosis not present

## 2017-12-24 DIAGNOSIS — R030 Elevated blood-pressure reading, without diagnosis of hypertension: Secondary | ICD-10-CM | POA: Diagnosis not present

## 2018-01-20 DIAGNOSIS — G2581 Restless legs syndrome: Secondary | ICD-10-CM | POA: Diagnosis not present

## 2018-01-20 DIAGNOSIS — F419 Anxiety disorder, unspecified: Secondary | ICD-10-CM | POA: Diagnosis not present

## 2018-01-20 DIAGNOSIS — M545 Low back pain: Secondary | ICD-10-CM | POA: Diagnosis not present

## 2018-01-20 DIAGNOSIS — Z720 Tobacco use: Secondary | ICD-10-CM | POA: Diagnosis not present

## 2018-01-20 DIAGNOSIS — Z13220 Encounter for screening for lipoid disorders: Secondary | ICD-10-CM | POA: Diagnosis not present

## 2018-01-23 DIAGNOSIS — F419 Anxiety disorder, unspecified: Secondary | ICD-10-CM | POA: Diagnosis not present

## 2018-01-23 DIAGNOSIS — Z720 Tobacco use: Secondary | ICD-10-CM | POA: Diagnosis not present

## 2018-01-23 DIAGNOSIS — M545 Low back pain: Secondary | ICD-10-CM | POA: Diagnosis not present

## 2018-01-23 DIAGNOSIS — Z6826 Body mass index (BMI) 26.0-26.9, adult: Secondary | ICD-10-CM | POA: Diagnosis not present

## 2018-01-23 DIAGNOSIS — G2581 Restless legs syndrome: Secondary | ICD-10-CM | POA: Diagnosis not present

## 2018-01-29 ENCOUNTER — Encounter: Payer: Self-pay | Admitting: Neurology

## 2018-01-29 ENCOUNTER — Ambulatory Visit: Payer: Medicare PPO | Admitting: Neurology

## 2018-01-29 VITALS — BP 116/75 | HR 61 | Wt 182.5 lb

## 2018-01-29 DIAGNOSIS — R52 Pain, unspecified: Secondary | ICD-10-CM | POA: Diagnosis not present

## 2018-01-29 DIAGNOSIS — G3281 Cerebellar ataxia in diseases classified elsewhere: Secondary | ICD-10-CM | POA: Diagnosis not present

## 2018-01-29 DIAGNOSIS — E538 Deficiency of other specified B group vitamins: Secondary | ICD-10-CM | POA: Diagnosis not present

## 2018-01-29 DIAGNOSIS — R799 Abnormal finding of blood chemistry, unspecified: Secondary | ICD-10-CM | POA: Diagnosis not present

## 2018-01-29 DIAGNOSIS — R269 Unspecified abnormalities of gait and mobility: Secondary | ICD-10-CM | POA: Diagnosis not present

## 2018-01-29 MED ORDER — CARBIDOPA-LEVODOPA 25-100 MG PO TABS: 1.0000 | ORAL_TABLET | Freq: Three times a day (TID) | ORAL | 6 refills | Status: DC

## 2018-01-29 NOTE — Progress Notes (Signed)
PATIENT: Michael Fitzgerald DOB: June 15, 1951  Chief Complaint  Patient presents with  . New Patient (Initial Visit)     HISTORICAL  Michael Fitzgerald is a 67 year old male, accompanied by his wife, seen in refer by neurosurgeon Dr. Brien Few for evaluation of gait abnormality, initial evaluation was on January 29, 2018.  I reviewed and summarized the referring note, he had a history of depression, anxiety, chronic migraine headache.  Around summer of 2018, he noticed gradual onset bilateral lower extremity especially calf area numbness tingling achy pain, he also noticed gradual onset low back pain, lower extremity weakness, was seen by neurosurgeon Dr. Trenton Gammon, later by pain management Dr. Brien Few, received a few rounds of epidural injection failed to improve his symptoms,  He also had MRIs, MRI of the brain showed no significant abnormality, MRI of lumbar and thoracic spine showed mild degenerative changes, but no significant canal foraminal stenosis to explain his gradual worsening gait abnormality,  He no longer has significant low back pain, denying neck pain, but his gait abnormality gradually getting worse since January 2019, he describes difficulty initiate gait, difficulty turning, there is no hand tremor,  Wife also reported some personality change, mild memory loss,  REVIEW OF SYSTEMS: Full 14 system review of systems performed and notable only for fatigue, hearing loss, ringing the ears, spinning sensation, shortness of breath, cough, snoring, feeling cold, joint pain, swelling, achy muscles, confusion, headaches, numbness, weakness, snoring, restless leg, dizziness, tremor  ALLERGIES: Allergies  Allergen Reactions  . Latex Other (See Comments)    Pt. States " makes my skin burn"  . Prednisone     hallucination  . Sildenafil Citrate     Chest pain    HOME MEDICATIONS: Current Outpatient Medications  Medication Sig Dispense Refill  . albuterol (PROVENTIL HFA;VENTOLIN HFA)  108 (90 BASE) MCG/ACT inhaler Inhale into the lungs every 6 (six) hours as needed for wheezing or shortness of breath.    . ALPRAZolam (XANAX) 0.5 MG tablet Take 0.5 mg by mouth at bedtime as needed. For sleep    . ibuprofen (ADVIL,MOTRIN) 200 MG tablet Take 200 mg by mouth every 6 (six) hours as needed for moderate pain.    Marland Kitchen oxyCODONE-acetaminophen (PERCOCET) 5-325 MG tablet Take by mouth.    . pramipexole (MIRAPEX) 1 MG tablet Take 1 mg by mouth daily.    . promethazine (PHENERGAN) 25 MG suppository Place 1 suppository (25 mg total) rectally every 6 (six) hours as needed for nausea or vomiting. 12 suppository 1   No current facility-administered medications for this visit.     PAST MEDICAL HISTORY: Past Medical History:  Diagnosis Date  . Anxiety 03/25/2011  . Arthritis   . 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 Complication of anesthesia    "wild when wakes up"  . COPD (chronic obstructive pulmonary disease) ([place])    "slight"  . Erectile dysfunction 03/25/2011  . GERD (gastroesophageal reflux disease)    rolaids occ  . Hepatitis C 03/25/2011  . Manic depressive disease manic phase ([place])    won't take med  . Night terrors   . Sleep apnea    no cpap  . Sleep walking     PAST SURGICAL HISTORY: Past Surgical History:  Procedure Laterality Date  . COLONOSCOPY  03/25/2011,16  . 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  Marland Kitchen LIVER  BIOPSY     Dr. Arlana Pouch at [facility] over 10 yrs ago.  Marland Kitchen PAROTIDECTOMY Left 05/25/2015  . PAROTIDECTOMY Left 05/25/2015   Procedure: LEFT PAROTIDECTOMY;  Surgeon: Izora Gala, MD;  Location: [facility];  Service: ENT;  Laterality: Left;  . TONSILLECTOMY      FAMILY HISTORY: History reviewed. No pertinent family history.  SOCIAL HISTORY:  Social History   Socioeconomic History  . Marital status: Married    Spouse name: Not on file  . Number of children: Not on file  .  Years of education: Not on file  . Highest education level: Not on file  Social Needs  . Financial resource strain: Not on file  . Food insecurity - worry: Not on file  . Food insecurity - inability: Not on file  . Transportation needs - medical: Not on file  . Transportation needs - non-medical: Not on file  Occupational History  . Not on file  Tobacco Use  . Smoking status: Current Every Day Smoker    Packs/day: 0.50    Years: 30.00    Pack years: 15.00    Types: Cigarettes  . Smokeless tobacco: Never Used  Substance and Sexual Activity  . Alcohol use: No    Comment: quit 3-4 yrs ago  . Drug use: Yes    Comment: drugs in past.  Cocaine. 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 2 tatoos on rt lower leg none since age 74.  Marland Kitchen Sexual activity: Not on file  Other Topics Concern  . Not on file  Social History Narrative  . Not on file     PHYSICAL EXAM   Vitals:   01/29/18 1411  BP: 116/75  Pulse: 61  Weight: 182 lb 8 oz (82.8 kg)    Not recorded      Body mass index is 26.19 kg/m.  PHYSICAL EXAMNIATION:  Gen: NAD, conversant, well nourised, obese, well groomed                     Cardiovascular: Regular rate rhythm, no peripheral edema, warm, nontender. Eyes: Conjunctivae clear without exudates or hemorrhage Neck: Supple, no carotid bruits. Pulmonary: Clear to auscultation bilaterally   NEUROLOGICAL EXAM:  MENTAL STATUS: Speech:    Speech is normal; fluent and spontaneous with normal comprehension.  Cognition:     Orientation to time, place and person     Normal recent and remote memory     Normal Attention span and concentration     Normal Language, naming, repeating,spontaneous speech     Fund of knowledge   CRANIAL NERVES: CN II: Visual fields are full to confrontation. Fundoscopic exam is normal with sharp discs and no vascular changes. Pupils are round equal and briskly reactive to light. CN III, IV, VI: extraocular movement are normal. No ptosis. CN V: Facial  sensation is intact to pinprick in all 3 divisions bilaterally. Corneal responses are intact.  CN VII: Face is symmetric with normal eye closure and smile. CN VIII: Hearing is normal to rubbing fingers CN IX, X: Palate elevates symmetrically. Phonation is normal. CN XI: Head turning and shoulder shrug are intact CN XII: Tongue is midline with normal movements and no atrophy.  MOTOR: He has mild fairly symmetric bilateral upper and lower extremity rigidity, bradykinesia, there is also mild bilateral hip flexion weakness.  REFLEXES: Reflexes are 3 and symmetric at the biceps, triceps, knees, and ankles. Plantar responses are extensor bilaterally, with none sustained bilateral ankle clonus.  SENSORY: Intact to light  touch, pinprick, positional sensation and vibratory sensation are intact in fingers and toes.  COORDINATION: Rapid alternating movements and fine finger movements are intact. There is no dysmetria on finger-to-nose and heel-knee-shin.    GAIT/STANCE: He needs pushed up to get up from seated position, leaning forward, unsteady, stiff gait,  DIAGNOSTIC DATA (LABS, IMAGING, TESTING) - I reviewed patient records, labs, notes, testing and imaging myself where available.   ASSESSMENT AND PLAN  Michael Fitzgerald is a 67 y.o. male   Subacute onset progressive worsening gait abnormality  Hyperreflexia in bilateral lower extremity, also evidence of symmetric rigidity, bradykinesia  Need to rule out cervical spondylitic myelopathy, remote possibility of inflammatory myopathy,  Proceed with MRI of cervical spine  Laboratory evaluation including CPK,  EMG nerve conduction study   Marcial Pacas, M.D. Ph.D.  [facility] [address] [place], [place] [address] Ph: [phone] Fax: [phone]  CC: Referring Provider

## 2018-01-30 ENCOUNTER — Telehealth: Payer: Self-pay | Admitting: Neurology

## 2018-01-30 LAB — CK: Total CK: 107 U/L (ref 24–204)

## 2018-01-30 LAB — VITAMIN B12: VITAMIN B 12: 308 pg/mL (ref 232–1245)

## 2018-01-30 LAB — ANA W/REFLEX: Anti Nuclear Antibody(ANA): NEGATIVE

## 2018-01-30 LAB — RPR: RPR Ser Ql: NONREACTIVE

## 2018-01-30 LAB — FOLATE: Folate: 10.3 ng/mL (ref >3.0)

## 2018-01-30 LAB — TSH: TSH: 1.37 u[IU]/mL (ref 0.450–4.500)

## 2018-01-30 LAB — SEDIMENTATION RATE: Sed Rate: 2 mm/hr (ref 0–30)

## 2018-01-30 LAB — C-REACTIVE PROTEIN: CRP: 1.1 mg/L (ref 0.0–4.9)

## 2018-01-30 NOTE — Telephone Encounter (Signed)
Mcarthur Rossetti Josem Kaufmann: [phone] (exp. 01/30/18 to 03/01/18) he is scheduled at [facility] for 02/05/18 arrival time is 1:45 pm patient is aware of this.

## 2018-02-05 ENCOUNTER — Ambulatory Visit (HOSPITAL_COMMUNITY)
Admission: RE | Admit: 2018-02-05 | Discharge: 2018-02-05 | Disposition: A | Discharge status: Home / Self Care | Payer: Medicare PPO | Source: Ambulatory Visit | Attending: Neurology | Admitting: Neurology

## 2018-02-05 ENCOUNTER — Telehealth (HOSPITAL_COMMUNITY): Payer: Self-pay | Admitting: Physician Assistant

## 2018-02-05 DIAGNOSIS — M47812 Spondylosis without myelopathy or radiculopathy, cervical region: Secondary | ICD-10-CM | POA: Diagnosis not present

## 2018-02-05 DIAGNOSIS — M4802 Spinal stenosis, cervical region: Secondary | ICD-10-CM | POA: Insufficient documentation

## 2018-02-05 DIAGNOSIS — M8938 Hypertrophy of bone, other site: Secondary | ICD-10-CM | POA: Diagnosis not present

## 2018-02-05 DIAGNOSIS — G3281 Cerebellar ataxia in diseases classified elsewhere: Secondary | ICD-10-CM | POA: Diagnosis not present

## 2018-02-05 DIAGNOSIS — M542 Cervicalgia: Secondary | ICD-10-CM | POA: Diagnosis not present

## 2018-02-05 NOTE — Telephone Encounter (Signed)
Patient called today about a appt he said was suppose to be set up by [facility]. She spoke with him on 01-30-18 and he wanted to go to rehab in [place]. The referral is in epic and says donot sch so the patient is going to call his MD to see what he need to do.

## 2018-02-06 ENCOUNTER — Telehealth: Payer: Self-pay | Admitting: Neurology

## 2018-02-06 NOTE — Telephone Encounter (Signed)
Please call patient, MRI of the cervical spine showed multilevel spondylosis, foraminal narrowing of variable degree at different levels, we will review MRI films at next visit   IMPRESSION: 1. 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 2. Moderate right foraminal narrowing at C4-5. 3. Mild central canal narrowing and moderate bilateral foraminal stenosis at C5-6. 4. Mild central and bilateral foraminal narrowing at C6-7. 5. Moderate bilateral facet hypertrophy at C7-T1 is worse on the right. There is no significant stenosis.

## 2018-02-06 NOTE — Telephone Encounter (Signed)
Spoke to patient - he is aware of results and will keep his appt NCV/EMG on 02/13/17.  Feels Sinemet has been very helpful.

## 2018-02-13 ENCOUNTER — Ambulatory Visit (INDEPENDENT_AMBULATORY_CARE_PROVIDER_SITE_OTHER): Payer: Medicare PPO | Admitting: Neurology

## 2018-02-13 DIAGNOSIS — R202 Paresthesia of skin: Secondary | ICD-10-CM | POA: Diagnosis not present

## 2018-02-13 DIAGNOSIS — Z0289 Encounter for other administrative examinations: Secondary | ICD-10-CM

## 2018-02-13 DIAGNOSIS — R2689 Other abnormalities of gait and mobility: Secondary | ICD-10-CM

## 2018-02-13 DIAGNOSIS — G20C Parkinsonism, unspecified: Secondary | ICD-10-CM | POA: Insufficient documentation

## 2018-02-13 DIAGNOSIS — G20 Parkinson's disease: Secondary | ICD-10-CM

## 2018-02-13 DIAGNOSIS — R269 Unspecified abnormalities of gait and mobility: Secondary | ICD-10-CM

## 2018-02-13 NOTE — Progress Notes (Signed)
PATIENT: Michael Fitzgerald DOB: 03-24-1951  No chief complaint on file.    HISTORICAL  DEIDRICK RAINEY is a 67 year old male, accompanied by his wife, seen in refer by neurosurgeon Dr. Brien Few for evaluation of gait abnormality, initial evaluation was on January 29, 2018.  I reviewed and summarized the referring note, he had a history of depression, anxiety, chronic migraine headache.  Around summer of 2018, he noticed gradual onset bilateral lower extremity especially calf area numbness tingling achy pain, he also noticed gradual onset low back pain, lower extremity weakness, was seen by neurosurgeon Dr. Trenton Gammon, later by pain management Dr. Brien Few, received a few rounds of epidural injection failed to improve his symptoms,  He also had MRIs, MRI of the brain showed no significant abnormality, MRI of lumbar and thoracic spine showed mild degenerative changes, but no significant canal foraminal stenosis to explain his gradual worsening gait abnormality,  He no longer has significant low back pain, denying neck pain, but his gait abnormality gradually getting worse since January 2019, he describes difficulty initiate gait, difficulty turning, there is no hand tremor,  Wife also reported some personality change, mild memory loss,  UPDATE February 13 2018: Laboratory evaluation seen March 2019 showed normal C-reactive protein, CPK, ESR, TSH, folic acid, ANA, RPR, vitamin B12  Electrodiagnostic study today is normal, there is no evidence of large fiber peripheral neuropathy, lumbosacral radiculopathy.  We have personally reviewed MRI of cervical spine in March 2019, mild multilevel degenerative disc disease, variable degree of foraminal narrowing, there was no evidence of nerve root compression or canal stenosis.    REVIEW OF SYSTEMS: Full 14 system review of systems performed and notable only for as above ALLERGIES: Allergies  Allergen Reactions  . Latex Other (See Comments)    Pt. States  " makes my skin burn"  . Prednisone     hallucination  . Sildenafil Citrate     Chest pain    HOME MEDICATIONS: Current Outpatient Medications  Medication Sig Dispense Refill  . albuterol (PROVENTIL HFA;VENTOLIN HFA) 108 (90 BASE) MCG/ACT inhaler Inhale into the lungs every 6 (six) hours as needed for wheezing or shortness of breath.    . ALPRAZolam (XANAX) 0.5 MG tablet Take 0.5 mg by mouth at bedtime as needed. For sleep    . carbidopa-levodopa (SINEMET IR) 25-100 MG tablet Take 1 tablet by mouth 3 (three) times daily. 90 tablet 6  . ibuprofen (ADVIL,MOTRIN) 200 MG tablet Take 200 mg by mouth every 6 (six) hours as needed for moderate pain.    Marland Kitchen oxyCODONE-acetaminophen (PERCOCET) 5-325 MG tablet Take by mouth.    . pramipexole (MIRAPEX) 1 MG tablet Take 1 mg by mouth daily.    . promethazine (PHENERGAN) 25 MG suppository Place 1 suppository (25 mg total) rectally every 6 (six) hours as needed for nausea or vomiting. 12 suppository 1   No current facility-administered medications for this visit.     PAST MEDICAL HISTORY: Past Medical History:  Diagnosis Date  . Anxiety 03/25/2011  . Arthritis   . 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 Complication of anesthesia    "wild when wakes up"  . COPD (chronic obstructive pulmonary disease) ([place])    "slight"  . Erectile dysfunction 03/25/2011  . GERD (gastroesophageal reflux disease)    rolaids occ  . Hepatitis C 03/25/2011  . Manic depressive disease manic phase ([place])  won't take med  . Night terrors   . Sleep apnea    no cpap  . Sleep walking     PAST SURGICAL HISTORY: Past Surgical History:  Procedure Laterality Date  . COLONOSCOPY  03/25/2011,16  . 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  Marland Kitchen LIVER BIOPSY     Dr. Arlana Pouch at [place] over 10 yrs ago.  Marland Kitchen PAROTIDECTOMY Left 05/25/2015  . PAROTIDECTOMY Left 05/25/2015   Procedure: LEFT  PAROTIDECTOMY;  Surgeon: Izora Gala, MD;  Location: [facility];  Service: ENT;  Laterality: Left;  . TONSILLECTOMY      FAMILY HISTORY: No family history on file.  SOCIAL HISTORY:  Social History   Socioeconomic History  . Marital status: Married    Spouse name: Not on file  . Number of children: Not on file  . Years of education: Not on file  . Highest education level: Not on file  Occupational History  . Not on file  Social Needs  . Financial resource strain: Not on file  . Food insecurity:    Worry: Not on file    Inability: Not on file  . Transportation needs:    Medical: Not on file    Non-medical: Not on file  Tobacco Use  . Smoking status: Current Every Day Smoker    Packs/day: 0.50    Years: 30.00    Pack years: 15.00    Types: Cigarettes  . Smokeless tobacco: Never Used  Substance and Sexual Activity  . Alcohol use: No    Comment: quit 3-4 yrs ago  . Drug use: Yes    Comment: drugs in past.  Cocaine. 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 2 tatoos on rt lower leg none since age 33.  Marland Kitchen Sexual activity: Not on file  Lifestyle  . Physical activity:    Days per week: Not on file    Minutes per session: Not on file  . Stress: Not on file  Relationships  . Social connections:    Talks on phone: Not on file    Gets together: Not on file    Attends religious service: Not on file    Active member of club or organization: Not on file    Attends meetings of clubs or organizations: Not on file    Relationship status: Not on file  . Intimate partner violence:    Fear of current or ex partner: Not on file    Emotionally abused: Not on file    Physically abused: Not on file    Forced sexual activity: Not on file  Other Topics Concern  . Not on file  Social History Narrative  . Not on file     PHYSICAL EXAM   There were no vitals filed for this visit.  Not recorded      There is no height or weight on file to calculate BMI.  PHYSICAL EXAMNIATION:  Gen: NAD, conversant,  well nourised, obese, well groomed                     Cardiovascular: Regular rate rhythm, no peripheral edema, warm, nontender. Eyes: Conjunctivae clear without exudates or hemorrhage Neck: Supple, no carotid bruits. Pulmonary: Clear to auscultation bilaterally   NEUROLOGICAL EXAM:  MENTAL STATUS: Speech:    Speech is normal; fluent and spontaneous with normal comprehension.  Cognition:     Orientation to time, place and person     Normal recent and remote  memory     Normal Attention span and concentration     Normal Language, naming, repeating,spontaneous speech     Fund of knowledge   CRANIAL NERVES: CN II: Visual fields are full to confrontation. Fundoscopic exam is normal with sharp discs and no vascular changes. Pupils are round equal and briskly reactive to light. CN III, IV, VI: extraocular movement are normal. No ptosis. CN V: Facial sensation is intact to pinprick in all 3 divisions bilaterally. Corneal responses are intact.  CN VII: Face is symmetric with normal eye closure and smile. CN VIII: Hearing is normal to rubbing fingers CN IX, X: Palate elevates symmetrically. Phonation is normal. CN XI: Head turning and shoulder shrug are intact CN XII: Tongue is midline with normal movements and no atrophy.  MOTOR: He has mild fairly symmetric bilateral upper and lower extremity rigidity, bradykinesia,   REFLEXES: Reflexes are 3 and symmetric at the biceps, triceps, knees, and ankles. Plantar responses are extensor bilaterally, with none sustained bilateral ankle clonus.  SENSORY: Intact to light touch, pinprick, positional sensation and vibratory sensation are intact in fingers and toes.  COORDINATION: Rapid alternating movements and fine finger movements are intact. There is no dysmetria on finger-to-nose and heel-knee-shin.    GAIT/STANCE: He needs pushed up to get up from seated position, leaning forward, decreased bilateral arm swing,  DIAGNOSTIC DATA (LABS,  IMAGING, TESTING) - I reviewed patient records, labs, notes, testing and imaging myself where available.   ASSESSMENT AND PLAN  TURRELL SEVERT is a 67 y.o. male   Gait abnormality, mild parkinsonian features,   He reported dramatic improvement with Sinemet 25/100 mg 3 times a day, there is no significant side effect noted, he can walk much better,  Continue moderate exercise,  Chronic low back pain: No evidence of lumbar radiculopathy, most consistent with musculoskeletal etiology, I have suggested heating pad, as needed NSAIDs,   Marcial Pacas, M.D. Ph.D.  [facility] [address], [place], [place] [address] Ph: [phone] Fax: [phone]  CC: Referring Provider

## 2018-02-13 NOTE — Procedures (Signed)
Full Name: Michael Fitzgerald Gender: Male MRN #: [id number] Date of Birth: 1951/04/16    Visit Date: 02/13/2018 12:27 Age: 67 Years 77 Months Old Examining Physician: Marcial Pacas, MD  Referring Physician: Krista Blue, MD History: 67 year old male, complains of bilateral lower extremity deep achy pain, low back pain,  Summary of the tests:  Nerve conduction study: Bilateral sural, superficial peroneal sensory responses were normal.  Bilateral tibial, peroneal to EDB motor responses were normal.  Electromyography: Selective needle examinations of right lower extremity muscles and right lumbosacral paraspinal muscles were normal.   Conclusion: This is a normal study.  There is no electrodiagnostic evidence of large fiber peripheral neuropathy or right lumbosacral radiculopathy.    ------------------------------- Marcial Pacas, M.D.  [facility] [place], [place] [address] Tel: [phone] Fax: [phone]        [facility]    Nerve / Sites Muscle Latency Ref. Amplitude Ref. Rel Amp Segments Distance Velocity Ref. Area    ms ms mV mV %  cm m/s m/s mVms  R Peroneal - EDB     Ankle EDB 5.3 ?6.5 3.8 ?2.0 100 Ankle - EDB 9   12.0     Fib head EDB 11.9  3.4  89.8 Fib head - Ankle 29 44 ?44 11.4     Pop fossa EDB 14.2  3.3  97.5 Pop fossa - Fib head 10 44 ?44 11.3         Pop fossa - Ankle      L Peroneal - EDB     Ankle EDB 5.6 ?6.5 2.7 ?2.0 100 Ankle - EDB 9   9.1     Fib head EDB 11.9  2.5  92 Fib head - Ankle 29 46 ?44 8.8     Pop fossa EDB 14.1  2.3  94.7 Pop fossa - Fib head 10 47 ?44 8.8         Pop fossa - Ankle      R Tibial - AH     Ankle AH 4.1 ?5.8 10.1 ?4.0 100 Ankle - AH 9   26.7     Pop fossa AH 12.9  6.5  63.8 Pop fossa - Ankle 37 42 ?41 19.4  L Tibial - AH     Ankle AH 3.7 ?5.8 8.9 ?4.0 100 Ankle - AH 9   25.0     Pop fossa AH 12.5  5.9  66 Pop fossa - Ankle 37 42 ?41 20.7             SNC    Nerve / Sites Rec. Site Peak Lat Ref.  Amp Ref.  Segments Distance    ms ms V V  cm  R Sural - Ankle (Calf)     Calf Ankle 3.1 ?4.4 10 ?6 Calf - Ankle 14  L Sural - Ankle (Calf)     Calf Ankle 3.4 ?4.4 8 ?6 Calf - Ankle 14  R Superficial peroneal - Ankle     Lat leg Ankle 4.0 ?4.4 6 ?6 Lat leg - Ankle 14  L Superficial peroneal - Ankle     Lat leg Ankle 4.1 ?4.4 7 ?6 Lat leg - Ankle 14               F  Wave    Nerve F Lat Ref.   ms ms  R Tibial - AH 50.8 ?56.0  L Tibial - AH 50.0 ?56.0  H Reflex    Nerve H Lat    ms    Left Right Ref.     Tibial - [place] 37.1 36.7 ?35.0            EMG full       EMG Summary Table    Spontaneous MUAP Recruitment  Muscle IA Fib PSW Fasc Other Amp Dur. Poly Pattern  R. Tibialis anterior Normal None None None _______ Normal Normal Normal Normal  R. Gastrocnemius (Medial head) Normal None None None _______ Normal Normal Normal Normal  R. Peroneus longus Normal None None None _______ Normal Normal Normal Normal  R. Vastus lateralis Normal None None None _______ Normal Normal Normal Normal  R. Biceps femoris (long head) Normal None None None _______ Normal Normal Normal Normal  R. Lumbar paraspinals (mid) Normal None None None _______ Normal Normal Normal Normal  R. Lumbar paraspinals (low) Normal None None None _______ Normal Normal Normal Normal

## 2018-02-18 ENCOUNTER — Ambulatory Visit: Payer: Medicare PPO | Admitting: Neurology

## 2018-02-18 ENCOUNTER — Telehealth: Payer: Self-pay | Admitting: *Deleted

## 2018-02-18 NOTE — Telephone Encounter (Signed)
Pt no showed his follow up appointment.

## 2018-02-19 ENCOUNTER — Encounter: Payer: Self-pay | Admitting: Neurology

## 2018-02-23 ENCOUNTER — Telehealth: Payer: Self-pay | Admitting: Neurology

## 2018-02-23 NOTE — Telephone Encounter (Signed)
Pt called stating he was unaware of his previous appt 4/3 stating he would like a call from the RN to r/s his 2 week follow up. Dr. Rhea Belton next available appt isn't until July

## 2018-02-23 NOTE — Telephone Encounter (Signed)
Spoke to patient - he apologized multiple times for missing his appt on 02/18/18.  He has been rescheduled on 03/02/18.

## 2018-03-02 ENCOUNTER — Encounter: Payer: Self-pay | Admitting: Neurology

## 2018-03-02 ENCOUNTER — Ambulatory Visit (INDEPENDENT_AMBULATORY_CARE_PROVIDER_SITE_OTHER): Payer: Medicare PPO | Admitting: Neurology

## 2018-03-02 VITALS — BP 120/79 | HR 79 | Ht 72.0 in | Wt 173.5 lb

## 2018-03-02 DIAGNOSIS — G20 Parkinson's disease: Secondary | ICD-10-CM | POA: Diagnosis not present

## 2018-03-02 MED ORDER — CARBIDOPA-LEVODOPA 25-100 MG PO TABS: 1.0000 | ORAL_TABLET | Freq: Three times a day (TID) | ORAL | 4 refills | Status: DC

## 2018-03-02 NOTE — Progress Notes (Signed)
PATIENT: Michael Fitzgerald DOB: Aug 24, 1951  Chief Complaint  Patient presents with  . Gait Abnormality    He is here with his wife, Michael Fitzgerald. Says his walking has dramatically improved since starting on Sinemet 25-126m, one tablet TID.  He is staying active and tries to walk at least two miles each day.  His tremors are also better and he is having an easier time when holding his utensils.  He is concerned about continued weight loss due to a lack of appetite.     HISTORICAL  Michael Fitzgerald a 67year old male, accompanied by his wife, seen in refer by neurosurgeon Dr. BBrien Fewfor evaluation of gait abnormality, initial evaluation was on January 29, 2018.  I reviewed and summarized the referring note, he had a history of depression, anxiety, chronic migraine headache.  Around summer of 2018, he noticed gradual onset bilateral lower extremity especially calf area numbness tingling achy pain, he also noticed gradual onset low back pain, lower extremity weakness, was seen by neurosurgeon Dr. PTrenton Gammon later by pain management Dr. BBrien Few received a few rounds of epidural injection failed to improve his symptoms,  He also had MRIs, MRI of the brain showed no significant abnormality, MRI of lumbar and thoracic spine showed mild degenerative changes, but no significant canal foraminal stenosis to explain his gradual worsening gait abnormality,  He no longer has significant low back pain, denying neck pain, but his gait abnormality gradually getting worse since January 2019, he describes difficulty initiate gait, difficulty turning, there is no hand tremor,  Wife also reported some personality change, mild memory loss,  Laboratory evaluation seen March 2019 showed normal C-reactive protein, CPK, ESR, TSH, folic acid, ANA, RPR, vitamin B12  Electrodiagnostic study on February 13 2018 is normal, there is no evidence of large fiber peripheral neuropathy, lumbosacral radiculopathy.  We have personally  reviewed MRI of cervical spine in March 2019, mild multilevel degenerative disc disease, variable degree of foraminal narrowing, there was no evidence of nerve root compression or canal stenosis.  UPDATE March 02, 2018: His gait abnormality has much improved with Sinemet 25/100 mg 3 times daily, but complains of GI side effect, he has 7 pound weight loss over the past few weeks  REVIEW OF SYSTEMS: Full 14 system review of systems performed and notable only for activity change, appetite change, fatigue ALLERGIES: Allergies  Allergen Reactions  . Latex Other (See Comments)    Pt. States " makes my skin burn"  . Prednisone     hallucination  . Sildenafil Citrate     Chest pain    HOME MEDICATIONS: Current Outpatient Medications  Medication Sig Dispense Refill  . albuterol (PROVENTIL HFA;VENTOLIN HFA) 108 (90 BASE) MCG/ACT inhaler Inhale into the lungs every 6 (six) hours as needed for wheezing or shortness of breath.    . ALPRAZolam (XANAX) 0.5 MG tablet Take 0.5 mg by mouth at bedtime as needed. For sleep    . carbidopa-levodopa (SINEMET IR) 25-100 MG tablet Take 1 tablet by mouth 3 (three) times daily. 90 tablet 6  . oxyCODONE-acetaminophen (PERCOCET) 5-325 MG tablet Take by mouth.    . pramipexole (MIRAPEX) 1 MG tablet Take 1 mg by mouth daily.     No current facility-administered medications for this visit.     PAST MEDICAL HISTORY: Past Medical History:  Diagnosis Date  . Anxiety 03/25/2011  . Arthritis   . 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 Complication of anesthesia    "wild when wakes up"  . COPD (chronic obstructive pulmonary disease) ([place])    "slight"  . Erectile dysfunction 03/25/2011  . GERD (gastroesophageal reflux disease)    rolaids occ  . Hepatitis C 03/25/2011  . Manic depressive disease manic phase ([place])    won't take med  . Night terrors   . Sleep apnea    no cpap  . Sleep walking     PAST  SURGICAL HISTORY: Past Surgical History:  Procedure Laterality Date  . COLONOSCOPY  03/25/2011,16  . 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  Marland Kitchen LIVER BIOPSY     Dr. Arlana Pouch at [facility] over 10 yrs ago.  Marland Kitchen PAROTIDECTOMY Left 05/25/2015  . PAROTIDECTOMY Left 05/25/2015   Procedure: LEFT PAROTIDECTOMY;  Surgeon: Izora Gala, MD;  Location: [facility];  Service: ENT;  Laterality: Left;  . TONSILLECTOMY      FAMILY HISTORY: History reviewed. No pertinent family history.  SOCIAL HISTORY:  Social History   Socioeconomic History  . Marital status: Married    Spouse name: Not on file  . Number of children: Not on file  . Years of education: Not on file  . Highest education level: Not on file  Occupational History  . Not on file  Social Needs  . Financial resource strain: Not on file  . Food insecurity:    Worry: Not on file    Inability: Not on file  . Transportation needs:    Medical: Not on file    Non-medical: Not on file  Tobacco Use  . Smoking status: Current Every Day Smoker    Packs/day: 0.50    Years: 30.00    Pack years: 15.00    Types: Cigarettes  . Smokeless tobacco: Never Used  Substance and Sexual Activity  . Alcohol use: No    Comment: quit 3-4 yrs ago  . Drug use: Yes    Comment: drugs in past.  Cocaine. 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 2 tatoos on rt lower leg none since age 26.  Marland Kitchen Sexual activity: Not on file  Lifestyle  . Physical activity:    Days per week: Not on file    Minutes per session: Not on file  . Stress: Not on file  Relationships  . Social connections:    Talks on phone: Not on file    Gets together: Not on file    Attends religious service: Not on file    Active member of club or organization: Not on file    Attends meetings of clubs or organizations: Not on file    Relationship status: Not on file  . Intimate partner violence:    Fear of current or ex partner: Not on file    Emotionally abused: Not on file     Physically abused: Not on file    Forced sexual activity: Not on file  Other Topics Concern  . Not on file  Social History Narrative  . Not on file     PHYSICAL EXAM   Vitals:   03/02/18 1115  BP: 120/79  Pulse: 79  Weight: 173 lb 8 oz (78.7 kg)  Height: 6' (1.829 m)    Not recorded      Body mass index is 23.53 kg/m.  PHYSICAL EXAMNIATION:  Gen: NAD, conversant, well nourised, obese, well groomed  Cardiovascular: Regular rate rhythm, no peripheral edema, warm, nontender. Eyes: Conjunctivae clear without exudates or hemorrhage Neck: Supple, no carotid bruits. Pulmonary: Clear to auscultation bilaterally   NEUROLOGICAL EXAM:  MENTAL STATUS: Speech:    Speech is normal; fluent and spontaneous with normal comprehension.  Cognition:     Orientation to time, place and person     Normal recent and remote memory     Normal Attention span and concentration     Normal Language, naming, repeating,spontaneous speech     Fund of knowledge   CRANIAL NERVES: CN II: Visual fields are full to confrontation. Fundoscopic exam is normal with sharp discs and no vascular changes. Pupils are round equal and briskly reactive to light. CN III, IV, VI: extraocular movement are normal. No ptosis. CN V: Facial sensation is intact to pinprick in all 3 divisions bilaterally. Corneal responses are intact.  CN VII: Face is symmetric with normal eye closure and smile. CN VIII: Hearing is normal to rubbing fingers CN IX, X: Palate elevates symmetrically. Phonation is normal. CN XI: Head turning and shoulder shrug are intact CN XII: Tongue is midline with normal movements and no atrophy.  MOTOR: He has mild fairly symmetric bilateral upper and lower extremity rigidity, bradykinesia, there is also mild bilateral hip flexion weakness.  REFLEXES: Reflexes are 3 and symmetric at the biceps, triceps, knees, and ankles. Plantar responses are extensor bilaterally, with none  sustained bilateral ankle clonus.  SENSORY: Intact to light touch, pinprick, positional sensation and vibratory sensation are intact in fingers and toes.  COORDINATION: Rapid alternating movements and fine finger movements are intact. There is no dysmetria on finger-to-nose and heel-knee-shin.    GAIT/STANCE: He needs pushed up to get up from seated position, leaning forward, unsteady, stiff gait,  DIAGNOSTIC DATA (LABS, IMAGING, TESTING) - I reviewed patient records, labs, notes, testing and imaging myself where available.   ASSESSMENT AND PLAN  OUSMAN DISE is a 67 y.o. male   Parkinsonism  Much improved with Sinemet 25/100 mg 3 times a day   continue moderate exercise     Marcial Pacas, M.D. Ph.D.  [facility] [address], [place], [place] [address] Ph: [phone] Fax: [phone]  CC: Referring Provider

## 2018-04-15 DIAGNOSIS — M5126 Other intervertebral disc displacement, lumbar region: Secondary | ICD-10-CM | POA: Diagnosis not present

## 2018-04-20 DIAGNOSIS — Z720 Tobacco use: Secondary | ICD-10-CM | POA: Diagnosis not present

## 2018-04-20 DIAGNOSIS — Z6826 Body mass index (BMI) 26.0-26.9, adult: Secondary | ICD-10-CM | POA: Diagnosis not present

## 2018-04-20 DIAGNOSIS — F419 Anxiety disorder, unspecified: Secondary | ICD-10-CM | POA: Diagnosis not present

## 2018-04-20 DIAGNOSIS — G20 Parkinson's disease: Secondary | ICD-10-CM | POA: Diagnosis not present

## 2018-04-20 DIAGNOSIS — G2581 Restless legs syndrome: Secondary | ICD-10-CM | POA: Diagnosis not present

## 2018-04-20 DIAGNOSIS — Z1389 Encounter for screening for other disorder: Secondary | ICD-10-CM | POA: Diagnosis not present

## 2018-04-20 DIAGNOSIS — M545 Low back pain: Secondary | ICD-10-CM | POA: Diagnosis not present

## 2018-04-20 DIAGNOSIS — Z1331 Encounter for screening for depression: Secondary | ICD-10-CM | POA: Diagnosis not present

## 2018-06-16 ENCOUNTER — Ambulatory Visit: Payer: Medicare PPO | Admitting: Neurology

## 2018-07-30 DIAGNOSIS — G20 Parkinson's disease: Secondary | ICD-10-CM | POA: Diagnosis not present

## 2018-07-30 DIAGNOSIS — Z6826 Body mass index (BMI) 26.0-26.9, adult: Secondary | ICD-10-CM | POA: Diagnosis not present

## 2018-07-30 DIAGNOSIS — Z720 Tobacco use: Secondary | ICD-10-CM | POA: Diagnosis not present

## 2018-07-30 DIAGNOSIS — G2581 Restless legs syndrome: Secondary | ICD-10-CM | POA: Diagnosis not present

## 2018-07-30 DIAGNOSIS — M545 Low back pain: Secondary | ICD-10-CM | POA: Diagnosis not present

## 2018-07-30 DIAGNOSIS — F419 Anxiety disorder, unspecified: Secondary | ICD-10-CM | POA: Diagnosis not present

## 2018-08-10 DIAGNOSIS — M5126 Other intervertebral disc displacement, lumbar region: Secondary | ICD-10-CM | POA: Diagnosis not present

## 2018-08-18 DIAGNOSIS — M48062 Spinal stenosis, lumbar region with neurogenic claudication: Secondary | ICD-10-CM | POA: Diagnosis not present

## 2018-08-31 NOTE — Progress Notes (Signed)
[facility]  PATIENT: SULEIMAN FINIGAN DOB: 1951-05-11   REASON FOR VISIT: Follow-up for parkinsonism HISTORY FROM: Patient and wife    HISTORY OF PRESENT ILLNESS: GOODWIN KAMPHAUS is a 67 year old male, accompanied by his wife, seen in refer by neurosurgeon Dr. Brien Few for evaluation of gait abnormality, initial evaluation was on January 29, 2018.  I reviewed and summarized the referring note, he had a history of depression, anxiety, chronic migraine headache.  Around summer of 2018, he noticed gradual onset bilateral lower extremity especially calf area numbness tingling achy pain, he also noticed gradual onset low back pain, lower extremity weakness, was seen by neurosurgeon Dr. Trenton Gammon, later by pain management Dr. Brien Few, received a few rounds of epidural injection failed to improve his symptoms,  He also had MRIs, MRI of the brain showed no significant abnormality, MRI of lumbar and thoracic spine showed mild degenerative changes, but no significant canal foraminal stenosis to explain his gradual worsening gait abnormality,  He no longer has significant low back pain, denying neck pain, but his gait abnormality gradually getting worse since January 2019, he describes difficulty initiate gait, difficulty turning, there is no hand tremor,  Wife also reported some personality change, mild memory loss,  Laboratory evaluation seen March 2019 showed normal C-reactive protein, CPK, ESR, TSH, folic acid, ANA, RPR, vitamin B12  Electrodiagnostic study on February 13 2018 is normal, there is no evidence of large fiber peripheral neuropathy, lumbosacral radiculopathy.  We have personally reviewed MRI of cervical spine in March 2019, mild multilevel degenerative disc disease, variable degree of foraminal narrowing, there was no evidence of nerve root compression or canal stenosis.  UPDATE March 02, 2018:YY His gait abnormality has much improved with Sinemet 25/100 mg 3  times daily, but complains of GI side effect, he has 7 pound weight loss over the past few weeks  UPDATE 10/15/2019CM Mr. Capobianco, 67 year old male returns for follow-up with history of parkinsonian.  He also has a history of low back pain recently got epidural in his spine which helped his lower back but did nothing for his legs.  He continues to complain of his legs being tremulous.  He tries to walk 1 to 2 miles a day but he gets fatigued.  Carbidopa levodopa have helped his symptoms however he does complain with GI side effects.  Fortunately he is gained about 5 pounds since last seen.  He has not had any falls.  Appetite is good and he is sleeping well.  Sometimes he uses a cane if he is having a bad day.  He is on chronic narcotic medication for his back.  He returns for reevaluation REVIEW OF SYSTEMS: Full 14 system review of systems performed and notable only for those listed, all others are neg:  Constitutional: Fatigue Cardiovascular: neg Ear/Nose/Throat: Hearing loss Skin: neg Eyes: neg Respiratory: neg Gastroitestinal: neg  Hematology/Lymphatic: neg  Endocrine: neg Musculoskeletal: Joint pain back pain walking difficulty Allergy/Immunology: neg Neurological: Tremors Psychiatric: Anxiety Sleep : neg   ALLERGIES: Allergies  Allergen Reactions  . Latex Other (See Comments)    Pt. States " makes my skin burn"  . Prednisone     hallucination  . Sildenafil Citrate     Chest pain    HOME MEDICATIONS: Outpatient Medications Prior to Visit  Medication Sig Dispense Refill  . albuterol (PROVENTIL HFA;VENTOLIN HFA) 108 (90 BASE) MCG/ACT inhaler Inhale into the lungs every 6 (six) hours as needed for wheezing or shortness of breath.    Marland Kitchen  ALPRAZolam (XANAX) 0.5 MG tablet Take 0.5 mg by mouth at bedtime as needed. For sleep    . carbidopa-levodopa (SINEMET IR) 25-100 MG tablet Take 1 tablet by mouth 3 (three) times daily. 270 tablet 4  . oxyCODONE-acetaminophen (PERCOCET) 5-325 MG  tablet Take by mouth.    . pramipexole (MIRAPEX) 1 MG tablet Take 1 mg by mouth daily.     No facility-administered medications prior to visit.     PAST MEDICAL HISTORY: Past Medical History:  Diagnosis Date  . Anxiety 03/25/2011  . Arthritis   . 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 Complication of anesthesia    "wild when wakes up"  . COPD (chronic obstructive pulmonary disease) ([place])    "slight"  . Erectile dysfunction 03/25/2011  . GERD (gastroesophageal reflux disease)    rolaids occ  . Hepatitis C 03/25/2011  . Manic depressive disease manic phase ([place])    won't take med  . Night terrors   . Sleep apnea    no cpap  . Sleep walking     PAST SURGICAL HISTORY: Past Surgical History:  Procedure Laterality Date  . COLONOSCOPY  03/25/2011,16  . 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  Marland Kitchen LIVER BIOPSY     Dr. Arlana Pouch at [facility] over 10 yrs ago.  Marland Kitchen PAROTIDECTOMY Left 05/25/2015  . PAROTIDECTOMY Left 05/25/2015   Procedure: LEFT PAROTIDECTOMY;  Surgeon: Izora Gala, MD;  Location: [facility];  Service: ENT;  Laterality: Left;  . TONSILLECTOMY      FAMILY HISTORY: History reviewed. No pertinent family history.  SOCIAL HISTORY: Social History   Socioeconomic History  . Marital status: Married    Spouse name: Not on file  . Number of children: Not on file  . Years of education: Not on file  . Highest education level: Not on file  Occupational History  . Not on file  Social Needs  . Financial resource strain: Not on file  . Food insecurity:    Worry: Not on file    Inability: Not on file  . Transportation needs:    Medical: Not on file    Non-medical: Not on file  Tobacco Use  . Smoking status: Current Every Day Smoker    Packs/day: 0.50    Years: 30.00    Pack years: 15.00    Types: Cigarettes  . Smokeless tobacco: Never Used  Substance and Sexual Activity  . Alcohol use: No     Comment: quit 3-4 yrs ago  . Drug use: Yes    Comment: drugs in past.  Cocaine. 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 2 tatoos on rt lower leg none since age 17.  Marland Kitchen Sexual activity: Not on file  Lifestyle  . Physical activity:    Days per week: Not on file    Minutes per session: Not on file  . Stress: Not on file  Relationships  . Social connections:    Talks on phone: Not on file    Gets together: Not on file    Attends religious service: Not on file    Active member of club or organization: Not on file    Attends meetings of clubs or organizations: Not on file    Relationship status: Not on file  . Intimate partner violence:    Fear of current or ex partner: Not on file    Emotionally  abused: Not on file    Physically abused: Not on file    Forced sexual activity: Not on file  Other Topics Concern  . Not on file  Social History Narrative  . Not on file     PHYSICAL EXAM  Vitals:   09/01/18 1339  BP: 134/74  Pulse: 76  Weight: 177 lb 3.2 oz (80.4 kg)  Height: '5\' 8"'$  (1.727 m)   Body mass index is 26.94 kg/m.  Generalized: Well developed, in no acute distress  Head: normocephalic and atraumatic,. Oropharynx benign neg Myerson sign, mild masking of the face Neck: Supple,  Musculoskeletal: No deformity   Neurological examination   Mentation: Alert oriented to time, place, history taking. Attention span and concentration appropriate. Recent and remote memory intact.  Follows all commands speech and language fluent.   Cranial nerve II-XII: Pupils were equal round reactive to light extraocular movements were full, visual field were full on confrontational test. Facial sensation and strength were normal. hearing was intact to finger rubbing bilaterally. Uvula tongue midline. head turning and shoulder shrug were normal and symmetric.Tongue protrusion into cheek strength was normal. Motor: Bilateral upper and lower extremity rigidity and bradykinesia he also has mild bilateral hip flexion  weakness  Sensory: normal and symmetric to light touch,  Coordination: finger-nose-finger, heel-to-shin bilaterally, no dysmetria Reflexes: 3+ upper lower and symmetric plantar responses were flexor bilaterally. Gait and Station: Rising up from seated position with push off, short steppage, unsteady stiff gait.  Several freezing episodes noted.  No assistive device   DIAGNOSTIC DATA (LABS, IMAGING, TESTING) - I reviewed patient records, labs, notes, testing and imaging myself where available.  Lab Results  Component Value Date   WBC 7.2 05/17/2015   HGB 15.6 05/17/2015   HCT 45.2 05/17/2015   MCV 95.0 05/17/2015   PLT 193 05/17/2015      Component Value Date/Time   NA 139 05/17/2015 1512   NA 139 03/25/2011   K 4.3 05/17/2015 1512   CL 107 05/17/2015 1512   CO2 25 05/17/2015 1512   GLUCOSE 96 05/17/2015 1512   BUN 9 05/17/2015 1512   CREATININE 1.11 05/17/2015 1512   CALCIUM 9.2 05/17/2015 1512   PROT 6.9 05/17/2015 1512   ALBUMIN 3.9 05/17/2015 1512   AST 16 05/17/2015 1512   ALT 12 (L) 05/17/2015 1512   ALKPHOS 72 05/17/2015 1512   BILITOT 0.7 05/17/2015 1512   GFRNONAA >60 05/17/2015 1512   GFRAA >60 05/17/2015 1512    Lab Results  Component Value Date   VITAMINB12 308 01/29/2018   Lab Results  Component Value Date   TSH 1.370 01/29/2018      ASSESSMENT AND PLAN ROEMELLO SPEYER is a 67 y.o. male   here to follow-up for parkinsonism which has improved on Sinemet however he continues to have gait difficulty and more tremors in the legs rather than the arms.  PLAN: Increase carbidopa levodopa 25 100- to 1 and 1/5 tabs 3 times a day Continue moderate exercise Get a flu shot Follow-up in 6 months Dennie Bible, [facility], [facility], APRN  [facility] [address], [place], [place] [address] [phone]

## 2018-09-01 ENCOUNTER — Ambulatory Visit: Payer: Medicare PPO | Admitting: Nurse Practitioner

## 2018-09-01 ENCOUNTER — Encounter: Payer: Self-pay | Admitting: Nurse Practitioner

## 2018-09-01 VITALS — BP 134/74 | HR 76 | Ht 68.0 in | Wt 177.2 lb

## 2018-09-01 DIAGNOSIS — R269 Unspecified abnormalities of gait and mobility: Secondary | ICD-10-CM | POA: Diagnosis not present

## 2018-09-01 DIAGNOSIS — G20 Parkinson's disease: Secondary | ICD-10-CM | POA: Diagnosis not present

## 2018-09-01 MED ORDER — CARBIDOPA-LEVODOPA 25-100 MG PO TABS: 1.5000 | ORAL_TABLET | Freq: Three times a day (TID) | ORAL | 6 refills | Status: DC

## 2018-09-01 NOTE — Patient Instructions (Signed)
Increase carbidopa levodopa 25 100- to 1 and 1/5 tabs 3 times a day Continue moderate exercise Get a flu shot Follow-up in 6 months

## 2018-09-04 DIAGNOSIS — Z23 Encounter for immunization: Secondary | ICD-10-CM | POA: Diagnosis not present

## 2018-09-08 NOTE — Progress Notes (Signed)
I have reviewed and agreed above plan. 

## 2018-09-23 DIAGNOSIS — M48062 Spinal stenosis, lumbar region with neurogenic claudication: Secondary | ICD-10-CM | POA: Diagnosis not present

## 2018-11-25 DIAGNOSIS — Z13220 Encounter for screening for lipoid disorders: Secondary | ICD-10-CM | POA: Diagnosis not present

## 2018-11-25 DIAGNOSIS — F419 Anxiety disorder, unspecified: Secondary | ICD-10-CM | POA: Diagnosis not present

## 2018-11-25 DIAGNOSIS — G20 Parkinson's disease: Secondary | ICD-10-CM | POA: Diagnosis not present

## 2018-11-30 ENCOUNTER — Encounter: Payer: Self-pay | Admitting: *Deleted

## 2018-11-30 ENCOUNTER — Telehealth: Payer: Self-pay

## 2018-11-30 DIAGNOSIS — G2581 Restless legs syndrome: Secondary | ICD-10-CM | POA: Diagnosis not present

## 2018-11-30 DIAGNOSIS — G20 Parkinson's disease: Secondary | ICD-10-CM | POA: Diagnosis not present

## 2018-11-30 DIAGNOSIS — Z0000 Encounter for general adult medical examination without abnormal findings: Secondary | ICD-10-CM | POA: Diagnosis not present

## 2018-11-30 DIAGNOSIS — F419 Anxiety disorder, unspecified: Secondary | ICD-10-CM | POA: Diagnosis not present

## 2018-11-30 DIAGNOSIS — Z6826 Body mass index (BMI) 26.0-26.9, adult: Secondary | ICD-10-CM | POA: Diagnosis not present

## 2018-11-30 DIAGNOSIS — Z720 Tobacco use: Secondary | ICD-10-CM | POA: Diagnosis not present

## 2018-11-30 DIAGNOSIS — M545 Low back pain: Secondary | ICD-10-CM | POA: Diagnosis not present

## 2018-11-30 NOTE — Telephone Encounter (Signed)
Reports his symptoms have improved today.  He is taking Mirapex 1mg  at bedtime from his PCP.  States they would like his RLS medication managed here.  He has scheduled an appt for this evaluation.

## 2018-11-30 NOTE — Telephone Encounter (Signed)
Patient mentioned that he has been having jerking in his left leg for 2 days. He stated that it happens every 10 seconds. He stated that it keeps him at night. He mentioned that he saw his PCP and stated that he should call our office to see if his medication could be increased. Please advise

## 2018-12-23 DIAGNOSIS — R52 Pain, unspecified: Secondary | ICD-10-CM | POA: Diagnosis not present

## 2018-12-23 DIAGNOSIS — T50905A Adverse effect of unspecified drugs, medicaments and biological substances, initial encounter: Secondary | ICD-10-CM | POA: Diagnosis not present

## 2018-12-23 DIAGNOSIS — T428X5A Adverse effect of antiparkinsonism drugs and other central muscle-tone depressants, initial encounter: Secondary | ICD-10-CM | POA: Diagnosis not present

## 2018-12-23 DIAGNOSIS — G20 Parkinson's disease: Secondary | ICD-10-CM | POA: Diagnosis not present

## 2018-12-23 DIAGNOSIS — W19XXXA Unspecified fall, initial encounter: Secondary | ICD-10-CM | POA: Diagnosis not present

## 2018-12-23 DIAGNOSIS — M62830 Muscle spasm of back: Secondary | ICD-10-CM | POA: Diagnosis not present

## 2018-12-23 DIAGNOSIS — F17200 Nicotine dependence, unspecified, uncomplicated: Secondary | ICD-10-CM | POA: Diagnosis not present

## 2018-12-23 DIAGNOSIS — Z8619 Personal history of other infectious and parasitic diseases: Secondary | ICD-10-CM | POA: Diagnosis not present

## 2018-12-23 DIAGNOSIS — M62838 Other muscle spasm: Secondary | ICD-10-CM | POA: Diagnosis not present

## 2018-12-31 ENCOUNTER — Ambulatory Visit: Payer: Medicare PPO | Admitting: Neurology

## 2018-12-31 ENCOUNTER — Encounter: Payer: Self-pay | Admitting: Neurology

## 2018-12-31 VITALS — BP 98/60 | HR 77 | Ht 68.0 in | Wt 183.5 lb

## 2018-12-31 DIAGNOSIS — R413 Other amnesia: Secondary | ICD-10-CM | POA: Diagnosis not present

## 2018-12-31 DIAGNOSIS — G20 Parkinson's disease: Secondary | ICD-10-CM | POA: Diagnosis not present

## 2018-12-31 DIAGNOSIS — R269 Unspecified abnormalities of gait and mobility: Secondary | ICD-10-CM | POA: Diagnosis not present

## 2018-12-31 MED ORDER — DONEPEZIL HCL 10 MG PO TABS: 10.0000 mg | ORAL_TABLET | Freq: Every day | ORAL | 6 refills | Status: DC

## 2018-12-31 MED ORDER — QUETIAPINE FUMARATE 25 MG PO TABS: 25.0000 mg | ORAL_TABLET | Freq: Every day | ORAL | 5 refills | Status: DC

## 2018-12-31 NOTE — Progress Notes (Signed)
[facility]  PATIENT: Michael Fitzgerald DOB: 07/20/1951  HISTORY OF PRESENT ILLNESS: Michael Fitzgerald is a 68 year old male, accompanied by his wife, seen in refer by neurosurgeon Dr. Brien Few for evaluation of gait abnormality, initial evaluation was on January 29, 2018.  I reviewed and summarized the referring note, he had a history of depression, anxiety, chronic migraine headache.  Around summer of 2018, he noticed gradual onset bilateral lower extremity especially calf area numbness tingling achy pain, he also noticed gradual onset low back pain, lower extremity weakness, was seen by neurosurgeon Dr. Trenton Gammon, later by pain management Dr. Brien Few, received a few rounds of epidural injection failed to improve his symptoms,  He also had MRIs, MRI of the brain showed no significant abnormality, MRI of lumbar and thoracic spine showed mild degenerative changes, but no significant canal foraminal stenosis to explain his gradual worsening gait abnormality,  He no longer has significant low back pain, denying neck pain, but his gait abnormality gradually getting worse since January 2019, he describes difficulty initiate gait, difficulty turning, there is no hand tremor,  Wife also reported some personality change, mild memory loss,  Laboratory evaluation seen March 2019 showed normal C-reactive protein, CPK, ESR, TSH, folic acid, ANA, RPR, vitamin B12  Electrodiagnostic study on February 13 2018 is normal, there is no evidence of large fiber peripheral neuropathy, lumbosacral radiculopathy.  We have personally reviewed MRI of cervical spine in March 2019, mild multilevel degenerative disc disease, variable degree of foraminal narrowing, there was no evidence of nerve root compression or canal stenosis.  UPDATE March 02, 2018:YY His gait abnormality has much improved with Sinemet 25/100 mg 3 times daily, but complains of GI side effect, he has 7 pound weight loss over the  past few weeks  UPDATE Dec 31 2018: Last visit was with [facility] in October 2019, complains of GI side effect with carbidopa levodopa, also complains of low back pain receiving epidural injection  He is with his daughter [name] and sister [name] at today's clinical visit, he had acute worsening over the past few months, increased confusion, gait abnormality, difficulty sleeping, episode of jumping pain throughout his body,  He was seen by emergency room, add on Mirapex 1 mg every night, gabapentin, with no significant change, making very sleepy, seems to be more agitated, confused, take frequent naps during the day, he also has worsening memory loss,   We personally reviewed MRI films, MRI of the brain January 2019, no acute abnormality,, MRI cervical spine March 2019, mild degenerative changes no significant canal or foraminal narrowing.  MRI of thoracic, lumbar spine in 2018, mild degenerative changes no evidence of nerve root compression  REVIEW OF SYSTEMS: Full 14 system review of systems performed and notable only for those listed, all others are neg:  Fatigue, swelling legs, hearing loss, ringing in ears, shortness of breath, cough, wheezing, snoring, feeling cold, joint pain, swelling, cramps, aching muscles, memory loss confusion, weakness, slurred speech, dizziness, passing out, insomnia, snoring, restless leg, depression, anxiety, not enough sleep, decreased energy, disinterested in activities, hallucinations, racing thoughts.  All rest review of the system were negative  ALLERGIES: Allergies  Allergen Reactions  . Latex Other (See Comments)    Pt. States " makes my skin burn"  . Prednisone     hallucination  . Sildenafil Citrate     Chest pain    HOME MEDICATIONS: Outpatient Medications Prior to Visit  Medication Sig Dispense Refill  . albuterol (PROVENTIL HFA;VENTOLIN HFA) 108 (  90 BASE) MCG/ACT inhaler Inhale into the lungs every 6 (six) hours as needed for wheezing or  shortness of breath.    . ALPRAZolam (XANAX) 0.5 MG tablet Take 0.5 mg by mouth at bedtime as needed. For sleep    . carbidopa-levodopa (SINEMET IR) 25-100 MG tablet Take 1.5 tablets by mouth 3 (three) times daily. 135 tablet 6  . gabapentin (NEURONTIN) 400 MG capsule Take 400 mg by mouth 3 (three) times daily.    Marland Kitchen oxyCODONE-acetaminophen (PERCOCET) 5-325 MG tablet Take by mouth as needed.     . pramipexole (MIRAPEX) 1 MG tablet Take 1 mg by mouth at bedtime.      No facility-administered medications prior to visit.     PAST MEDICAL HISTORY: Past Medical History:  Diagnosis Date  . Anxiety 03/25/2011  . Arthritis   . 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 Complication of anesthesia    "wild when wakes up"  . COPD (chronic obstructive pulmonary disease) ([place])    "slight"  . Depression   . Erectile dysfunction 03/25/2011  . GERD (gastroesophageal reflux disease)    rolaids occ  . Hepatitis C 03/25/2011  . Manic depressive disease manic phase ([place])    won't take med  . Night terrors   . Parkinsonism ([place])   . RLS (restless legs syndrome)   . Sleep apnea    no cpap  . Sleep walking     PAST SURGICAL HISTORY: Past Surgical History:  Procedure Laterality Date  . COLONOSCOPY  03/25/2011,16  . 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  Marland Kitchen LIVER BIOPSY     Dr. Arlana Pouch at [place] over 10 yrs ago.  Marland Kitchen PAROTIDECTOMY Left 05/25/2015  . PAROTIDECTOMY Left 05/25/2015   Procedure: LEFT PAROTIDECTOMY;  Surgeon: Izora Gala, MD;  Location: [facility];  Service: ENT;  Laterality: Left;  . TONSILLECTOMY      FAMILY HISTORY: Family History  Problem Relation Age of Onset  . Colon cancer Mother   . Other Father        unsure of history    SOCIAL HISTORY: Social History   Socioeconomic History  . Marital status: Married    Spouse name: Not on file  . Number of children: 2  . Years of education: 76  .  Highest education level: High school graduate  Occupational History  . Occupation: Retired  Scientific laboratory technician  . Financial resource strain: Not on file  . Food insecurity:    Worry: Not on file    Inability: Not on file  . Transportation needs:    Medical: Not on file    Non-medical: Not on file  Tobacco Use  . Smoking status: Current Every Day Smoker    Packs/day: 0.50    Years: 30.00    Pack years: 15.00    Types: Cigarettes  . Smokeless tobacco: Never Used  Substance and Sexual Activity  . Alcohol use: No    Comment: quit  . Drug use: Yes    Comment: drugs in past.  Cocaine. 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 2 tatoos on rt lower leg none since age 89.  Marland Kitchen Sexual activity: Not on file  Lifestyle  . Physical activity:    Days per week: Not on file    Minutes per session: Not on file  . Stress: Not on file  Relationships  . Social connections:  Talks on phone: Not on file    Gets together: Not on file    Attends religious service: Not on file    Active member of club or organization: Not on file    Attends meetings of clubs or organizations: Not on file    Relationship status: Not on file  . Intimate partner violence:    Fear of current or ex partner: Not on file    Emotionally abused: Not on file    Physically abused: Not on file    Forced sexual activity: Not on file  Other Topics Concern  . Not on file  Social History Narrative   Lives at home with wife,   Right-handed.   3 cups caffeine per day.     PHYSICAL EXAM  Vitals:   12/31/18 1257  BP: 98/60  Pulse: 77  Weight: 183 lb 8 oz (83.2 kg)  Height: _0  (1.727 m)   Body mass index is 27.9 kg/m.  Generalized: Well developed, in no acute distress  Head: normocephalic and atraumatic,. Oropharynx benign neg Myerson sign, mild masking of the face Neck: Supple,  Musculoskeletal: No deformity   Neurological examination  MMSE - Mini Mental State Exam 12/31/2018  Orientation to time 4  Orientation to Place 5    Registration 3  Attention/ Calculation 5  Recall 0  Language- name 2 objects 2  Language- repeat 1  Language- follow 3 step command 3  Language- read & follow direction 1  Write a sentence 1  Copy design 1  Total score 26   Animal naming 11. Cranial nerve II-XII: Pupils were equal round reactive to light extraocular movements were full, visual field were full on confrontational test. Facial sensation and strength were normal. hearing was intact to finger rubbing bilaterally. Uvula tongue midline. head turning and shoulder shrug were normal and symmetric.Tongue protrusion into cheek strength was normal. Motor: Bilateral upper and lower extremity mild rigidity and bradykinesia, he also has mild bilateral hip flexion weakness  Sensory: normal and symmetric to light touch,  Coordination: finger-nose-finger, heel-to-shin bilaterally, no dysmetria Reflexes: 3+ upper lower and symmetric plantar responses were flexor bilaterally. Gait and Station: Rising up from seated position by pushing on chair arm,, short steppage, unsteady stiff gait.  Leaning backwards   DIAGNOSTIC DATA (LABS, IMAGING, TESTING) - I reviewed patient records, labs, notes, testing and imaging myself where available.  Lab Results  Component Value Date   WBC 7.2 05/17/2015   HGB 15.6 05/17/2015   HCT 45.2 05/17/2015   MCV 95.0 05/17/2015   PLT 193 05/17/2015      Component Value Date/Time   NA 139 05/17/2015 1512   NA 139 03/25/2011   K 4.3 05/17/2015 1512   CL 107 05/17/2015 1512   CO2 25 05/17/2015 1512   GLUCOSE 96 05/17/2015 1512   BUN 9 05/17/2015 1512   CREATININE 1.11 05/17/2015 1512   CALCIUM 9.2 05/17/2015 1512   PROT 6.9 05/17/2015 1512   ALBUMIN 3.9 05/17/2015 1512   AST 16 05/17/2015 1512   ALT 12 (L) 05/17/2015 1512   ALKPHOS 72 05/17/2015 1512   BILITOT 0.7 05/17/2015 1512   GFRNONAA >60 05/17/2015 1512   GFRAA >60 05/17/2015 1512    Lab Results  Component Value Date   VITAMINB12 308  01/29/2018   Lab Results  Component Value Date   TSH 1.370 01/29/2018      ASSESSMENT AND PLAN  Parkinsonism Mild cognitive impairment with agitation  Most consistent with central nervous  system degenerative disorder, cannot rule out the possibility of Lewy body dementia with his memory loss, parkinsonian features came within 1 year, also significant fluctuation,  Previous extensive laboratory evaluation failed to demonstrate treatable etiology  UA sample today to rule out infection  Keep Sinemet 25/100 mg 1/2 tablet 3 times a day  Add on Aricept 10 mg daily  Stop Mirapex, gabapentin to avoid the side effect,  Start Seroquel 25 mg every night   Marcial Pacas, M.D. Ph.D.  [facility] [place], [place] [address] Phone: [phone] Fax:      [phone]

## 2019-01-01 LAB — URINALYSIS, ROUTINE W REFLEX MICROSCOPIC
BILIRUBIN UA: NEGATIVE
Glucose, UA: NEGATIVE
NITRITE UA: NEGATIVE
RBC, UA: NEGATIVE
Specific Gravity, UA: 1.025 (ref 1.005–1.030)
Urobilinogen, Ur: 1 mg/dL (ref 0.2–1.0)
pH, UA: 5.5 (ref 5.0–7.5)

## 2019-01-01 LAB — MICROSCOPIC EXAMINATION

## 2019-01-14 ENCOUNTER — Other Ambulatory Visit: Payer: Self-pay | Admitting: *Deleted

## 2019-01-14 MED ORDER — DONEPEZIL HCL 10 MG PO TABS: 10.0000 mg | ORAL_TABLET | Freq: Every day | ORAL | 3 refills | Status: DC

## 2019-01-14 MED ORDER — CARBIDOPA-LEVODOPA 25-100 MG PO TABS: 1.5000 | ORAL_TABLET | Freq: Three times a day (TID) | ORAL | 1 refills | Status: DC

## 2019-01-17 IMAGING — MR MR CERVICAL SPINE W/O CM
4 of 5 series · 19 of 48 positions shown · non-contrast
Comparison: None.

CLINICAL DATA: Neck pain. Bilateral arm numbness. Cerebellar
ataxia.

EXAM:
MRI CERVICAL SPINE WITHOUT CONTRAST
TECHNIQUE: Multiplanar, multisequence MR imaging of the cervical spine was
performed. No intravenous contrast was administered.

[Series 3: T2 · sagittal · 3.0mm · 0.42mm/px · 6 of 13 slices shown (1 of 2)]
[im 1/13]
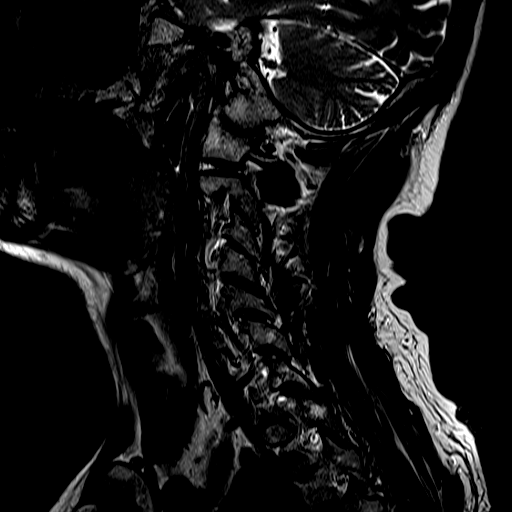
[im 3/13]
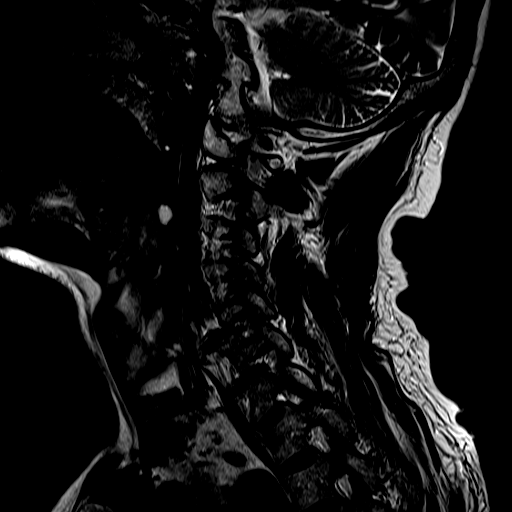
[im 5/13]
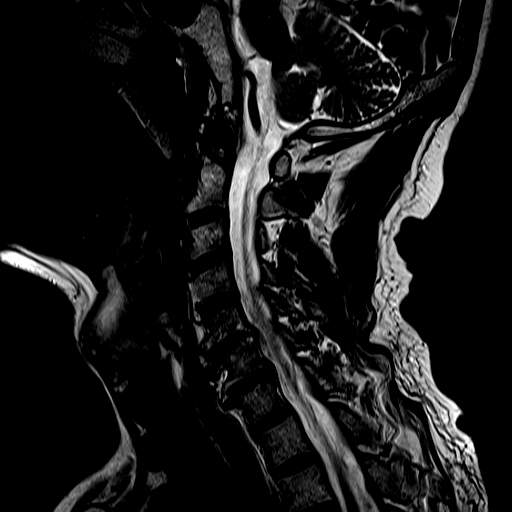
[im 8/13]
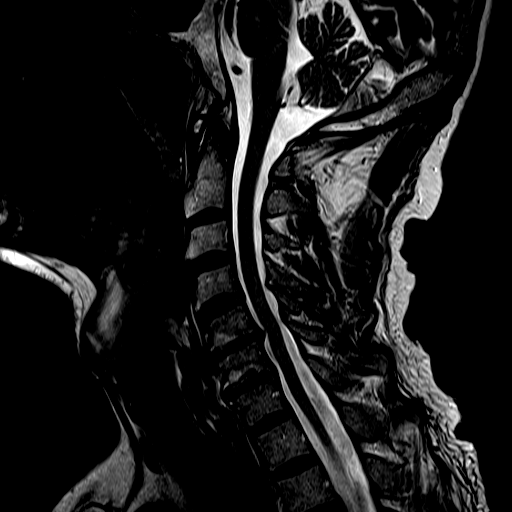
[im 10/13]
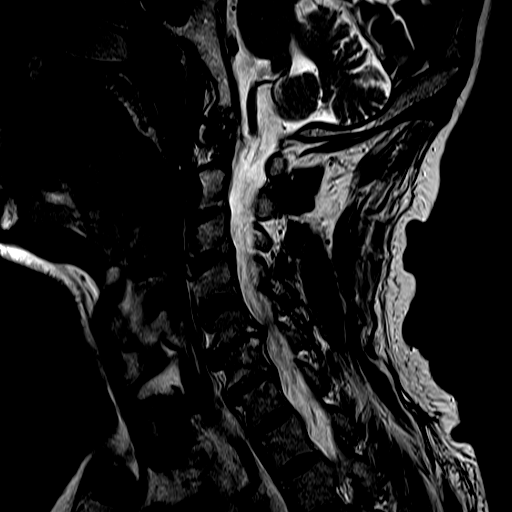
[im 13/13]
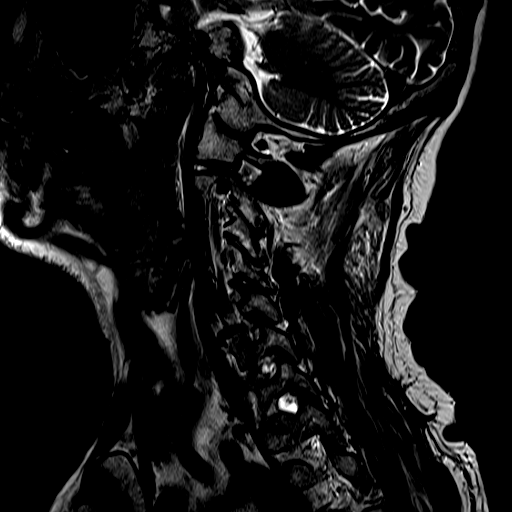

[Series 4: FLAIR · sagittal · 3.0mm · 0.62mm/px · 3 of 13 slices shown]
[im 3/13]
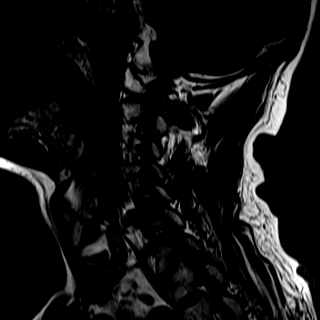
[im 8/13]
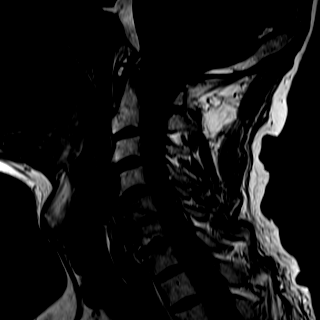
[im 13/13]
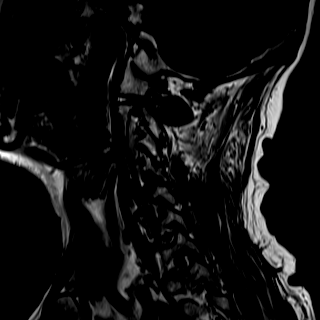

[Series 5: ir sagital · sagittal · 3.0mm · 0.34mm/px · 3 of 13 slices shown]
[im 3/13]
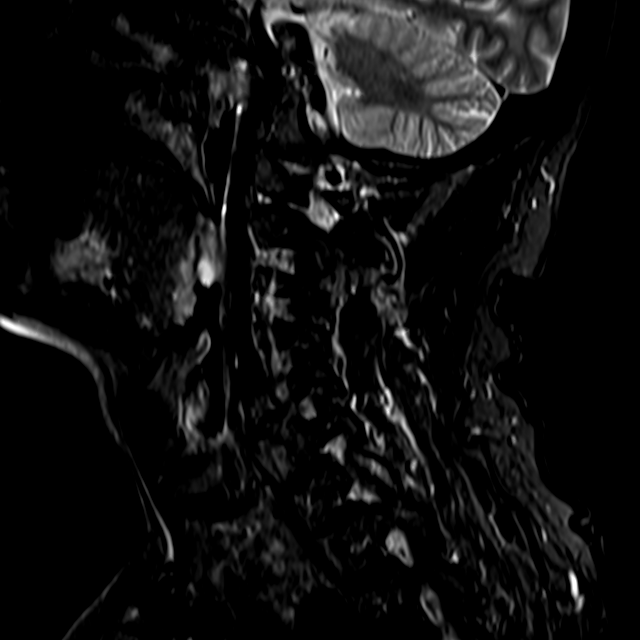
[im 8/13]
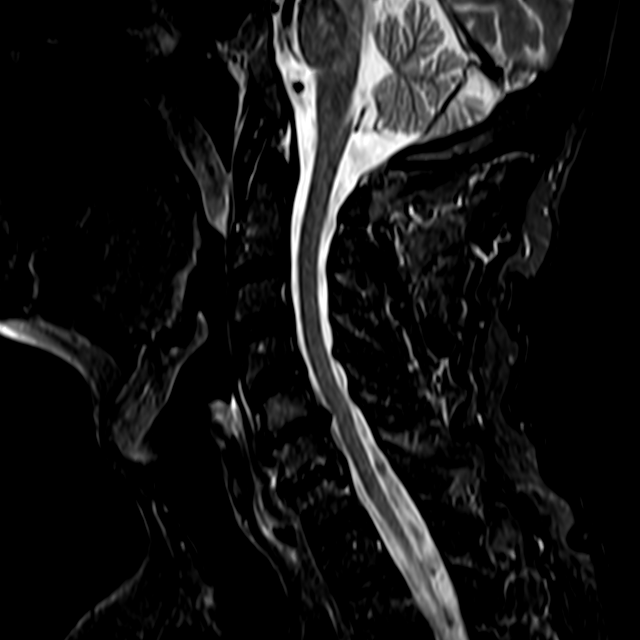
[im 13/13]
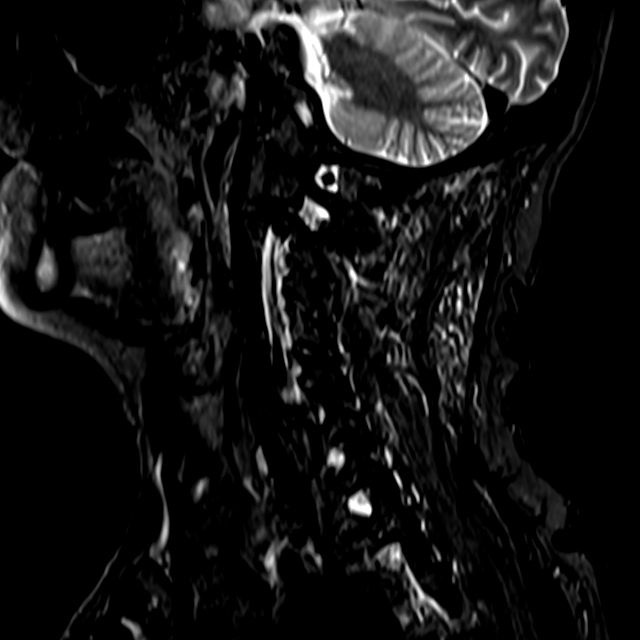

[Series 7: T2 · axial · 3.0mm · 0.21mm/px · z∈[-88,-3]mm · 7 of 33 slices shown (2 of 2)]
[im 1/33]
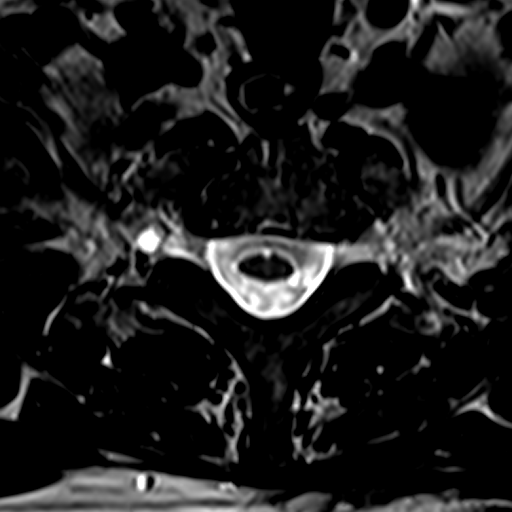
[im 5/33]
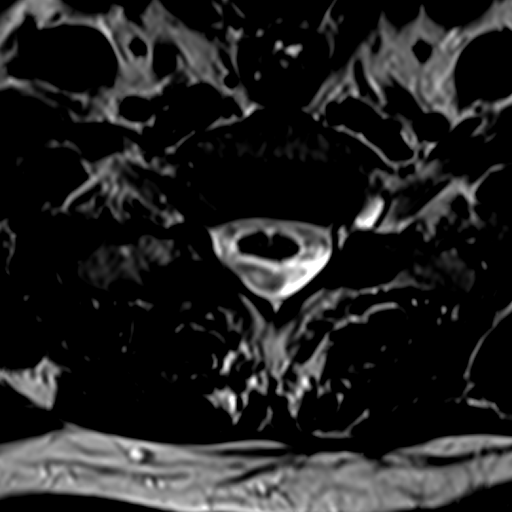
[im 10/33]
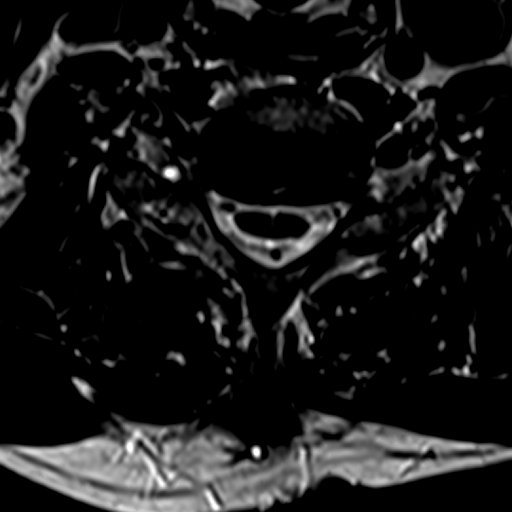
[im 14/33]
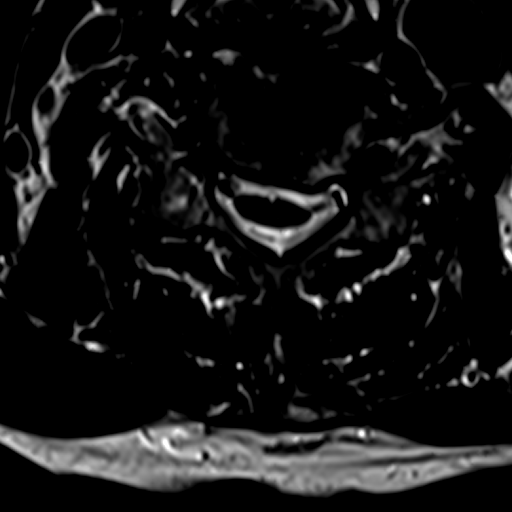
[im 17/33]
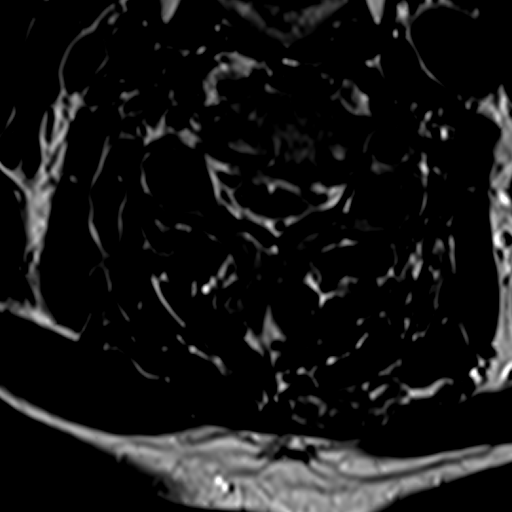
[im 19/33]
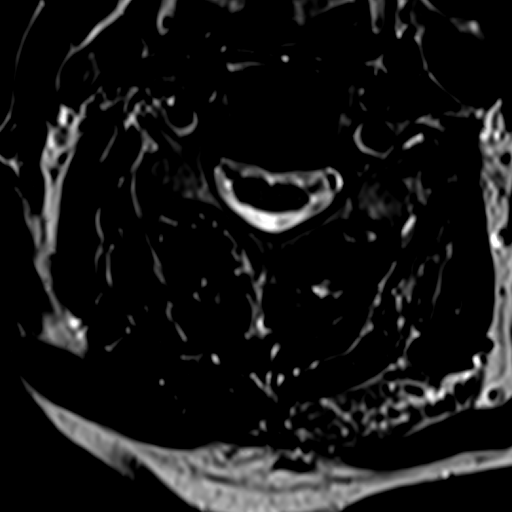
[im 28/33]
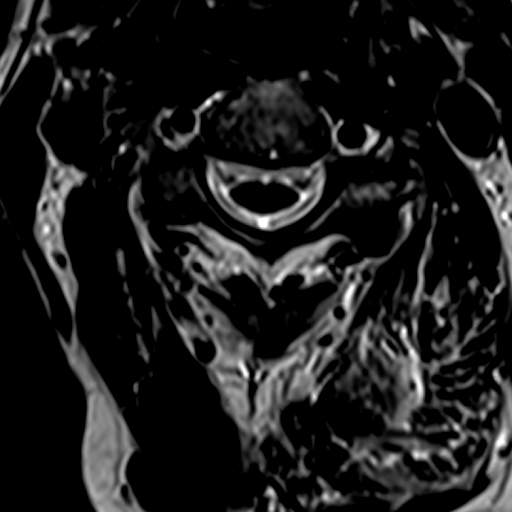

[19 of 48 positions shown; findings below may reference images not displayed]

FINDINGS: Alignment: Slight degenerative retrolisthesis is present at C5-6. AP
alignment is otherwise anatomic.

Vertebrae: Chronic endplate marrow changes are present at C5-6 and
C6-7. Marrow signal and vertebral body heights are otherwise normal.

Cord: Normal signal is present in the cervical and upper thoracic
spinal cord to the lowest imaged level, T1-2.

Posterior Fossa, vertebral arteries, paraspinal tissues: The
craniocervical junction is normal. Flow is present in the vertebral
arteries bilaterally. Paraspinous soft tissues are unremarkable.

Disc levels:

C2-3: Negative.

C3-4: Uncovertebral and facet disease contributes to mild foraminal
narrowing bilaterally. The central canal is patent.

C4-5: Asymmetric uncovertebral and facet disease contributes to
moderate right foraminal narrowing. The central canal is patent.

C5-6: A broad-based disc osteophyte complex is asymmetric to the
left. There is partial effacement of the ventral CSF. Moderate
foraminal narrowing is present bilaterally.

C6-7: A broad-based disc osteophyte complex present. There is
partial effacement of the ventral CSF. Uncovertebral spurring
contributes to mild foraminal narrowing bilaterally.

C7-T1: Moderate facet hypertrophy is worse on the right. There is no
significant stenosis.
IMPRESSION: 1. Multilevel spondylosis of the cervical spine with right-sided
stenosis as described.
2. Moderate right foraminal narrowing at C4-5.
3. Mild central canal narrowing and moderate bilateral foraminal
stenosis at C5-6.
4. Mild central and bilateral foraminal narrowing at C6-7.
5. Moderate bilateral facet hypertrophy at C7-T1 is worse on the
right. There is no significant stenosis.

## 2019-01-19 NOTE — Telephone Encounter (Signed)
Sinemet 25/100 mg 1 and half tablet 3 times a day

## 2019-02-03 ENCOUNTER — Ambulatory Visit: Payer: Medicare PPO | Admitting: Neurology

## 2019-02-03 ENCOUNTER — Other Ambulatory Visit: Payer: Self-pay

## 2019-02-03 ENCOUNTER — Encounter: Payer: Self-pay | Admitting: Neurology

## 2019-02-03 VITALS — BP 126/79 | HR 64 | Ht 68.0 in | Wt 183.2 lb

## 2019-02-03 DIAGNOSIS — G20 Parkinson's disease: Secondary | ICD-10-CM

## 2019-02-03 DIAGNOSIS — R413 Other amnesia: Secondary | ICD-10-CM

## 2019-02-03 NOTE — Patient Instructions (Signed)
Start taking 12.5 mg Seroquel when the sun goes down to see if it helps with leg cramps  Start taking sinemet 25-100 mg 2 tablets in the morning, 1.5 tablets at lunch, 1.5 tablets in the evening x 1 week Then do 2 tablets in the morning, 2 tablets at lunch, 1.5 tablets in the evening x 1 week Then take 2 tablets three times a day if tolerating

## 2019-02-03 NOTE — Progress Notes (Signed)
PATIENT: Michael Fitzgerald DOB: 1951/02/21  REASON FOR VISIT: follow up HISTORY FROM: patient  HISTORY OF PRESENT ILLNESS: Today 02/03/19  HISTORY  Michael Kelley Hankinsis a 68 year old male, accompanied by his wife, seen in refer by neurosurgeon Dr. Brien Few for evaluation of gait abnormality, initial evaluation was on January 29, 2018.  I reviewed and summarized the referring note, he had a history of depression, anxiety, chronic migraine headache.  Around summer of 2018, he noticed gradual onset bilateral lower extremity especially calf area numbness tingling achy pain, he also noticed gradual onset low back pain, lower extremity weakness, was seen by neurosurgeon Dr. Trenton Gammon, later by pain management Dr. Brien Few, received a few rounds of epidural injection failed to improve his symptoms,  He also had MRIs, MRI of the brain showed no significant abnormality, MRI of lumbar and thoracic spine showed mild degenerative changes, but no significant canal foraminal stenosis to explain his gradual worsening gait abnormality,  He no longer has significant low back pain, denying neck pain, but his gait abnormality gradually getting worse since January 2019, he describes difficulty initiate gait, difficulty turning, there is no hand tremor,  Wife also reported some personality change, mild memory loss,  Laboratory evaluation seen March 2019 showed normal C-reactive protein, CPK, ESR, TSH, folic acid, ANA, RPR, vitamin B12  Electrodiagnostic studyon March 29 2019is normal, there is no evidence of large fiber peripheral neuropathy, lumbosacral radiculopathy.  We have personally reviewed MRI of cervical spine in March 2019, mild multilevel degenerative disc disease, variable degree of foraminal narrowing, there was no evidence of nerve root compression or canal stenosis.  UPDATEApril 15, 2019:YY His gait abnormality has much improved with Sinemet 25/100 mg 3 times daily, but complains of GI  side effect, he has 7 pound weight loss over the past few weeks  UPDATE Dec 31 2018: Last visit was with [facility] in October 2019, complains of GI side effect with carbidopa levodopa, also complains of low back pain receiving epidural injection  He is with his daughter Michael Fitzgerald and sister Michael Fitzgerald at today's clinical visit, he had acute worsening over the past few months, increased confusion, gait abnormality, difficulty sleeping, episode of jumping pain throughout his body,  He was seen by emergency room, add on Mirapex 1 mg every night, gabapentin, with no significant change, making very sleepy, seems to be more agitated, confused, take frequent naps during the day, he also has worsening memory loss,   We personally reviewed MRI films, MRI of the brain January 2019, no acute abnormality,, MRI cervical spine March 2019, mild degenerative changes no significant canal or foraminal narrowing.  MRI of thoracic, lumbar spine in 2018, mild degenerative changes no evidence of nerve root compression  Update February 03, 2019 SS: Last seen December 31, 2018, added on Aricept 10 mg daily, Seroquel 25 mg at night for agitation.  Stopped Mirapex, gabapentin due to side effect.  Continue taking Sinemet 25/100 mg, 1.5 tablet 3 times a day.  MMSE was 26/30.  In the past few months his increased confusion, gait abnormality, difficulty sleeping, episode of jumping pain to his body.  He presents today for follow-up accompanied by his wife.  He reports he is still not sleeping at night, and having leg cramps, muscle jerks all over his body.  He reports he and his wife no longer sleep in the same bed because of this.  Today he thinks he needs to increase his Sinemet.  He feels like he notices weakness in his  legs before his next Sinemet dose.  He reports he has had 2 falls, the last one was 6 weeks ago.  He reports that his memory is better.  He reports his hallucinations and agitation have improved since taking Seroquel.   Overall he feels much better after stopping the gabapentin.  Has not had any problems swallowing, drooling.  He does report that he has urinary urgency.  He thinks his walking is better, hopes to continue to improve to be able to mow his lawn.  He presents today for follow-up with his wife.   REVIEW OF SYSTEMS: Out of a complete 14 system review of symptoms, the patient complains only of the following symptoms, and all other reviewed systems are negative.  Fatigue, hearing loss, ringing in ears, cold intolerance, restless leg, insomnia, frequent waking, snoring, sleep talking, sleepwalking, acting out dreams, difficulty urinating, joint pain, back pain, aching muscles, muscle cramps, walking difficulty, neck pain, neck stiffness, dizziness, headache, speech difficulty, weakness, tremors, passing out, agitation, confusion, depression, nervous/anxious  ALLERGIES: Allergies  Allergen Reactions   Latex Other (See Comments)    Pt. States " makes my skin burn"   Gabapentin Other (See Comments)    Violent, memory loss, loss of appetite.    Prednisone     hallucination   Sildenafil Citrate     Chest pain    HOME MEDICATIONS: Outpatient Medications Prior to Visit  Medication Sig Dispense Refill   albuterol (PROVENTIL HFA;VENTOLIN HFA) 108 (90 BASE) MCG/ACT inhaler Inhale into the lungs every 6 (six) hours as needed for wheezing or shortness of breath.     ALPRAZolam (XANAX) 0.5 MG tablet Take 0.5 mg by mouth at bedtime as needed. For sleep     carbidopa-levodopa (SINEMET IR) 25-100 MG tablet Take 1.5 tablets by mouth 3 (three) times daily. 405 tablet 1   donepezil (ARICEPT) 10 MG tablet Take 1 tablet (10 mg total) by mouth at bedtime. 90 tablet 3   oxyCODONE-acetaminophen (PERCOCET) 5-325 MG tablet Take by mouth as needed.      QUEtiapine (SEROQUEL) 25 MG tablet Take 1 tablet (25 mg total) by mouth at bedtime. 30 tablet 5   ALPRAZolam (XANAX) 1 MG tablet Take 1 mg by mouth as needed.      gabapentin (NEURONTIN) 400 MG capsule Take 400 mg by mouth 3 (three) times daily.     No facility-administered medications prior to visit.     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   LIVER BIOPSY     Dr. Arlana Pouch at [place] over 10 yrs ago.  PAROTIDECTOMY Left 05/25/2015   PAROTIDECTOMY Left 05/25/2015   Procedure: LEFT PAROTIDECTOMY;  Surgeon: Izora Gala, MD;  Location: [facility] OR;  Service: ENT;  Laterality: Left;   TONSILLECTOMY      FAMILY HISTORY: Family History  Problem Relation Age of Onset   Colon cancer Mother    Other Father        unsure of history    SOCIAL HISTORY: Social History   Socioeconomic History   Marital status: Married    Spouse name: Not on file   Number of children: 2   Years of education: 12   Highest education level: High school graduate  Occupational History   Occupation: Retired  Scientist, product/process development strain: Not on file   Food insecurity:    Worry: Not on file    Inability: Not on Lexicographer needs:    Medical: Not on file    Non-medical: Not on file  Tobacco Use   Smoking status: Current Every Day Smoker    Packs/day: 0.50     Years: 30.00    Pack years: 15.00    Types: Cigarettes   Smokeless tobacco: Never Used  Substance and Sexual Activity   Alcohol use: No    Comment: quit   Drug use: Yes    Comment: drugs in past.  Cocaine. 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 2 tatoos on rt lower leg none since age 17.   Sexual activity: Not on file  Lifestyle   Physical activity:    Days per week: Not on file    Minutes per session: Not on file   Stress: Not on file  Relationships   Social connections:    Talks on phone: Not on file    Gets together: Not on file    Attends religious service: Not on file    Active member of club or organization: Not on file    Attends meetings of clubs or organizations: Not on file    Relationship status: Not on file   Intimate partner violence:    Fear of current or ex partner: Not on file    Emotionally abused: Not on file    Physically abused: Not on file    Forced sexual activity: Not on file  Other Topics Concern   Not on file  Social History Narrative   Lives at home with wife,   Right-handed.   3 cups caffeine per day.      PHYSICAL EXAM  Vitals:   02/03/19 1431  BP: 126/79  Pulse: 64  Weight: 183 lb 3.2 oz (83.1 kg)  Height: _0  (1.727 m)   Body mass index is 27.86 kg/m.  Generalized: Well developed, in no acute distress   Neurological examination  Mentation: Alert oriented to time, place, history taking. Follows all commands speech and language fluent Cranial nerve II-XII: Pupils were equal round reactive to light. Extraocular movements were full, visual field were full on confrontational test. Facial sensation and strength were normal. Uvula tongue midline. Head turning and shoulder shrug were normal and symmetric. Motor: The motor testing reveals 5 over 5 strength of all 4 extremities. Good symmetric motor tone is noted throughout.  Normal finger tap, top taps. No upper extremity rigidity noted. Sensory: Sensory testing is intact to soft touch on all 4  extremities. No evidence of extinction is noted.  Coordination: Cerebellar testing reveals good finger-nose-finger and heel-to-shin bilaterally.  Gait and station: Short steps, moderate pace, moderate armswing.  Tandem  gait is unsteady Reflexes: Deep tendon reflexes are symmetric and normal bilaterally.   DIAGNOSTIC DATA (LABS, IMAGING, TESTING) - I reviewed patient records, labs, notes, testing and imaging myself where available.  Lab Results  Component Value Date   WBC 7.2 05/17/2015   HGB 15.6 05/17/2015   HCT 45.2 05/17/2015   MCV 95.0 05/17/2015   PLT 193 05/17/2015      Component Value Date/Time   NA 139 05/17/2015 1512   NA 139 03/25/2011   K 4.3 05/17/2015 1512   CL 107 05/17/2015 1512   CO2 25 05/17/2015 1512   GLUCOSE 96 05/17/2015 1512   BUN 9 05/17/2015 1512   CREATININE 1.11 05/17/2015 1512   CALCIUM 9.2 05/17/2015 1512   PROT 6.9 05/17/2015 1512   ALBUMIN 3.9 05/17/2015 1512   AST 16 05/17/2015 1512   ALT 12 (L) 05/17/2015 1512   ALKPHOS 72 05/17/2015 1512   BILITOT 0.7 05/17/2015 1512   GFRNONAA >60 05/17/2015 1512   GFRAA >60 05/17/2015 1512   No results found for: CHOL, HDL, LDLCALC, LDLDIRECT, TRIG, CHOLHDL No results found for: HGBA1C Lab Results  Component Value Date   VITAMINB12 308 01/29/2018   Lab Results  Component Value Date   TSH 1.370 01/29/2018      ASSESSMENT AND PLAN 68 y.o. 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 here with:  1.  Parkinsonism 2.  Mild cognitive impairment  Overall, he is going better. He continues to complain of leg cramps, jerks  of pain all over his body in the evening time when the sun goes down. This keeps him up at night and prevents him from sleeping. His memory is better. Continue taking Aricept 10 mg at bedtime. He is tolerating Sinemet 25/100 1.5 tablets three times daily. He reports he will notice weakness in legs when it is getting time for his next dose. His agitation, hallucinations at bedtime are much better with Seroquel 25 mg. I talked with Dr. Krista Blue.  He can start taking 12.5 mg of Seroquel in the evening. Hopefully this will help with his leg cramps.  He can increase his Sinemet 25/100.  He will increase slowly by only taking 2 tablets in the morning, 1.5 tablets at lunch, 1.5 tablets in the evening x 1 week. He can then increase to 2 tablets in the morning, 2 tablets at lunch, 1.5 tablets in the evening x1 week.  If he tolerates the increase he can then go ahead and increase to 2 tablets 3 times daily.  He is no longer taking mirapex or gabapentin. He will follow-up in 3 months or sooner if needed.  He is requesting paperwork for a handicap sticker as he reports his has expired.  Paperwork was filled out and provided to the patient.  I advised him that if symptoms worsen or developing new symptoms he should let us know.   I spent 15 minutes with the patient. 50% of this time was spent discussing his plan of care.    Butler Denmark, AGNP-C, DNP 02/03/2019, 3:58 PM [facility] [address] [place], [place] [address] [phone]

## 2019-02-04 NOTE — Progress Notes (Signed)
I have reviewed and agreed above plan. 

## 2019-03-09 ENCOUNTER — Ambulatory Visit: Payer: Medicare PPO | Admitting: Neurology

## 2019-03-22 DIAGNOSIS — E782 Mixed hyperlipidemia: Secondary | ICD-10-CM | POA: Diagnosis not present

## 2019-03-22 DIAGNOSIS — Z13220 Encounter for screening for lipoid disorders: Secondary | ICD-10-CM | POA: Diagnosis not present

## 2019-03-22 DIAGNOSIS — G20 Parkinson's disease: Secondary | ICD-10-CM | POA: Diagnosis not present

## 2019-03-25 DIAGNOSIS — Z720 Tobacco use: Secondary | ICD-10-CM | POA: Diagnosis not present

## 2019-03-25 DIAGNOSIS — G2581 Restless legs syndrome: Secondary | ICD-10-CM | POA: Diagnosis not present

## 2019-03-25 DIAGNOSIS — M545 Low back pain: Secondary | ICD-10-CM | POA: Diagnosis not present

## 2019-03-25 DIAGNOSIS — G20 Parkinson's disease: Secondary | ICD-10-CM | POA: Diagnosis not present

## 2019-03-25 DIAGNOSIS — F419 Anxiety disorder, unspecified: Secondary | ICD-10-CM | POA: Diagnosis not present

## 2019-03-25 DIAGNOSIS — Z6828 Body mass index (BMI) 28.0-28.9, adult: Secondary | ICD-10-CM | POA: Diagnosis not present

## 2019-03-30 ENCOUNTER — Other Ambulatory Visit: Payer: Self-pay | Admitting: Diagnostic Neuroimaging

## 2019-04-01 ENCOUNTER — Ambulatory Visit (INDEPENDENT_AMBULATORY_CARE_PROVIDER_SITE_OTHER): Payer: Medicare PPO | Admitting: Neurology

## 2019-04-01 ENCOUNTER — Encounter: Payer: Self-pay | Admitting: Neurology

## 2019-04-01 ENCOUNTER — Other Ambulatory Visit: Payer: Self-pay

## 2019-04-01 DIAGNOSIS — G20 Parkinson's disease: Secondary | ICD-10-CM

## 2019-04-01 DIAGNOSIS — G3184 Mild cognitive impairment, so stated: Secondary | ICD-10-CM

## 2019-04-01 MED ORDER — QUETIAPINE FUMARATE 25 MG PO TABS: 25.0000 mg | ORAL_TABLET | Freq: Every day | ORAL | 4 refills | Status: DC

## 2019-04-01 MED ORDER — CARBIDOPA-LEVODOPA 25-100 MG PO TABS: 2.0000 | ORAL_TABLET | Freq: Three times a day (TID) | ORAL | 4 refills | Status: DC

## 2019-04-01 MED ORDER — DONEPEZIL HCL 10 MG PO TABS: 10.0000 mg | ORAL_TABLET | Freq: Every day | ORAL | 4 refills | Status: DC

## 2019-04-01 NOTE — Progress Notes (Signed)
PATIENT: Michael Fitzgerald DOB: 1951-05-11  REASON FOR VISIT: follow up HISTORY FROM: patient  HISTORY OF PRESENT ILLNESS: Today 04/01/19  HISTORY  Michael Fitzgerald a 68 year old male, accompanied by his wife, seen in refer by neurosurgeon Dr. Brien Few for evaluation of gait abnormality, initial evaluation was on January 29, 2018.  I reviewed and summarized the referring note, he had a history of depression, anxiety, chronic migraine headache.  Around summer of 2018, he noticed gradual onset bilateral lower extremity especially calf area numbness tingling achy pain, he also noticed gradual onset low back pain, lower extremity weakness, was seen by neurosurgeon Dr. Trenton Gammon, later by pain management Dr. Brien Few, received a few rounds of epidural injection failed to improve his symptoms,  He also had MRIs, MRI of the brain showed no significant abnormality, MRI of lumbar and thoracic spine showed mild degenerative changes, but no significant canal foraminal stenosis to explain his gradual worsening gait abnormality,  He no longer has significant low back pain, denying neck pain, but his gait abnormality gradually getting worse since January 2019, he describes difficulty initiate gait, difficulty turning, there is no hand tremor,  Wife also reported some personality change, mild memory loss,  Laboratory evaluation seen March 2019 showed normal C-reactive protein, CPK, ESR, TSH, folic acid, ANA, RPR, vitamin B12  Electrodiagnostic studyon March 29 2019is normal, there is no evidence of large fiber peripheral neuropathy, lumbosacral radiculopathy.  We have personally reviewed MRI of cervical spine in March 2019, mild multilevel degenerative disc disease, variable degree of foraminal narrowing, there was no evidence of nerve root compression or canal stenosis.  UPDATEApril 15, 2019:YY His gait abnormality has much improved with Sinemet 25/100 mg 3 times daily, but complains of GI  side effect, he has 7 pound weight loss over the past few weeks  UPDATE Dec 31 2018: Last visit was with [facility] in October 2019, complains of GI side effect with carbidopa levodopa, also complains of low back pain receiving epidural injection  He is with his daughter Michael Fitzgerald and sister Michael Fitzgerald at today's clinical visit, he had acute worsening over the past few months, increased confusion, gait abnormality, difficulty sleeping, episode of jumping pain throughout his body,  He was seen by emergency room, add on Mirapex 1 mg every night, gabapentin, with no significant change, making very sleepy, seems to be more agitated, confused, take frequent naps during the day, he also has worsening memory loss,   We personally reviewed MRI films, MRI of the brain January 2019, no acute abnormality,, MRI cervical spine March 2019, mild degenerative changes no significant canal or foraminal narrowing.  MRI of thoracic, lumbar spine in 2018, mild degenerative changes no evidence of nerve root compression.  Virtual Visit via Video  I connected with Michael Fitzgerald on 04/01/19 at  by Video and verified that I am speaking with the correct person using two identifiers.   I discussed the limitations, risks, security and privacy concerns of performing an evaluation and management service by video and the availability of in person appointments. I also discussed with the patient that there may be a patient responsible charge related to this service. The patient expressed understanding and agreed to proceed.   History of Present Illness: He is taking carbidopa levodopa 25/100 mg 2 tablets 3 times a day, which has controlled his Parkinson's symptoms much better, he is tolerating Aricept 10 mg daily, Seroquel 25 mg every night," I have not feel this good for a long time"  Observations/Objective: I have reviewed problem lists, medications, allergies.  Awake, alert, oriented to history taking and casual  conversation.  Assessment and Plan: Parkinson's disease Mild cognitive impairment  Refilled his medications Sinemet 25/100 mg 2 tablets 3 times a day, Seroquel 25 mg every night, Aricept 10 mg daily  Continue moderate exercise   Follow Up Instructions:   Return to clinic in 6 months    I discussed the assessment and treatment plan with the patient. The patient was provided an opportunity to ask questions and all were answered. The patient agreed with the plan and demonstrated an understanding of the instructions.   The patient was advised to call back or seek an in-person evaluation if the symptoms worsen or if the condition fails to improve as anticipated.  I provided 30 minutes of non-face-to-face time during this encounter.   Marcial Pacas, MD

## 2019-04-22 DIAGNOSIS — I10 Essential (primary) hypertension: Secondary | ICD-10-CM | POA: Diagnosis not present

## 2019-04-22 DIAGNOSIS — Z6827 Body mass index (BMI) 27.0-27.9, adult: Secondary | ICD-10-CM | POA: Diagnosis not present

## 2019-04-22 DIAGNOSIS — M48062 Spinal stenosis, lumbar region with neurogenic claudication: Secondary | ICD-10-CM | POA: Diagnosis not present

## 2019-05-03 ENCOUNTER — Telehealth (HOSPITAL_COMMUNITY): Payer: Self-pay | Admitting: Rehabilitation

## 2019-05-03 NOTE — Telephone Encounter (Signed)
The above patient or their representative was contacted and gave the following answers to these questions:         Do you have any of the following symptoms? No Fever                    Cough                   Shortness of breath  Do  you have any of the following other symptoms? No  muscle pain         vomiting,        diarrhea        rash         weakness        red eye        abdominal pain         bruising         bleeding              joint pain           severe headache  Have you been in contact with someone who was or has been sick in the past 2 weeks? No Yes                 Unsure                         Unable to assess   Does the person that you were in contact with have any of the following symptoms?  Cough         shortness of breath           muscle pain         vomiting,            diarrhea            rash            weakness           fever            red eye           abdominal pain          bruising  or  bleeding                joint pain                severe headache             Have you  or someone you have been in contact with traveled internationally in the last month?  No      If yes, which countries?  Have you  or someone you have been in contact with traveled outside [place] in the last month?  No      If yes, which state and city?  COMMENTS OR ACTION PLAN FOR THIS PATIENT:    

## 2019-05-04 ENCOUNTER — Other Ambulatory Visit: Payer: Self-pay

## 2019-05-04 ENCOUNTER — Ambulatory Visit (HOSPITAL_COMMUNITY)
Admission: RE | Admit: 2019-05-04 | Discharge: 2019-05-04 | Disposition: A | Discharge status: Home / Self Care | Payer: Medicare PPO | Source: Ambulatory Visit | Attending: Family | Admitting: Family

## 2019-05-04 ENCOUNTER — Other Ambulatory Visit (HOSPITAL_COMMUNITY): Payer: Self-pay | Admitting: Neurosurgery

## 2019-05-04 DIAGNOSIS — M48062 Spinal stenosis, lumbar region with neurogenic claudication: Secondary | ICD-10-CM

## 2019-05-11 ENCOUNTER — Ambulatory Visit: Payer: Medicare PPO | Admitting: Neurology

## 2019-06-18 DIAGNOSIS — G20 Parkinson's disease: Secondary | ICD-10-CM | POA: Diagnosis not present

## 2019-06-18 DIAGNOSIS — G2581 Restless legs syndrome: Secondary | ICD-10-CM | POA: Diagnosis not present

## 2019-06-29 ENCOUNTER — Telehealth: Payer: Self-pay | Admitting: Neurology

## 2019-06-29 NOTE — Telephone Encounter (Signed)
Michael Fitzgerald from [facility] called wanting to speak to RN or provider concerning the pt's QUEtiapine (SEROQUEL) 25 MG tablet. They would like to know if he can be taken off of it due to it reacting with other medications. Please advise.

## 2019-06-29 NOTE — Telephone Encounter (Signed)
I have talked with Michael Fitzgerald, clinical pharmacist from her primary care, who is concerned about the potential interaction of Mirapex and Seroquel  Patient does complains of restless leg symptoms before he goes to bed every night, difficulty sleeping, he also has parkinsonian features with mild memory loss  I have suggested stop Mirapex, may consider low-dose extended release Sinemet, or gabapentin,

## 2019-07-19 DIAGNOSIS — G2581 Restless legs syndrome: Secondary | ICD-10-CM | POA: Diagnosis not present

## 2019-07-19 DIAGNOSIS — F419 Anxiety disorder, unspecified: Secondary | ICD-10-CM | POA: Diagnosis not present

## 2019-07-22 ENCOUNTER — Encounter: Payer: Self-pay | Admitting: *Deleted

## 2019-07-22 ENCOUNTER — Telehealth: Payer: Self-pay | Admitting: *Deleted

## 2019-07-22 NOTE — Telephone Encounter (Signed)
Received a call from [place] at his PCP office.  States that since the patient stopped his Mirapex, he has been experiencing an increase in restless leg symptoms.    Per vo by Dr. Krista Blue, since the patient has memory issues and due to his other current medications (especially Seroquel), she recommends starting him on gabapentin 100mg , 3-4 tablets at bedtime (instead of going back on Mirapex).  This information was relayed to [place] who verbalized understanding and repeated it back.  His PCP will be sending in the gabapentin prescription for him.

## 2019-07-23 DIAGNOSIS — Z720 Tobacco use: Secondary | ICD-10-CM | POA: Diagnosis not present

## 2019-07-23 DIAGNOSIS — F419 Anxiety disorder, unspecified: Secondary | ICD-10-CM | POA: Diagnosis not present

## 2019-07-23 DIAGNOSIS — Z6826 Body mass index (BMI) 26.0-26.9, adult: Secondary | ICD-10-CM | POA: Diagnosis not present

## 2019-07-23 DIAGNOSIS — M545 Low back pain: Secondary | ICD-10-CM | POA: Diagnosis not present

## 2019-07-23 DIAGNOSIS — G2581 Restless legs syndrome: Secondary | ICD-10-CM | POA: Diagnosis not present

## 2019-07-23 DIAGNOSIS — G20 Parkinson's disease: Secondary | ICD-10-CM | POA: Diagnosis not present

## 2019-08-11 DIAGNOSIS — R251 Tremor, unspecified: Secondary | ICD-10-CM | POA: Diagnosis not present

## 2019-08-11 DIAGNOSIS — G4761 Periodic limb movement disorder: Secondary | ICD-10-CM | POA: Diagnosis not present

## 2019-08-11 DIAGNOSIS — M6281 Muscle weakness (generalized): Secondary | ICD-10-CM | POA: Diagnosis not present

## 2019-08-11 DIAGNOSIS — R55 Syncope and collapse: Secondary | ICD-10-CM | POA: Diagnosis not present

## 2019-08-11 DIAGNOSIS — M62838 Other muscle spasm: Secondary | ICD-10-CM | POA: Diagnosis not present

## 2019-08-12 DIAGNOSIS — M48062 Spinal stenosis, lumbar region with neurogenic claudication: Secondary | ICD-10-CM | POA: Diagnosis not present

## 2019-08-12 DIAGNOSIS — M47816 Spondylosis without myelopathy or radiculopathy, lumbar region: Secondary | ICD-10-CM | POA: Diagnosis not present

## 2019-08-26 DIAGNOSIS — R251 Tremor, unspecified: Secondary | ICD-10-CM | POA: Diagnosis not present

## 2019-08-31 ENCOUNTER — Other Ambulatory Visit: Payer: Self-pay | Admitting: *Deleted

## 2019-08-31 ENCOUNTER — Other Ambulatory Visit: Payer: Self-pay | Admitting: Neurology

## 2019-08-31 MED ORDER — PREGABALIN 50 MG PO CAPS: 50.0000 mg | ORAL_CAPSULE | Freq: Three times a day (TID) | ORAL | 2 refills | Status: DC

## 2019-08-31 NOTE — Telephone Encounter (Signed)
Pt called stating that his tremors are getting really bad and he is getting muscle spasms about every 3 hrs that are very painful no matter if he is sleeping or awake. Please advise.

## 2019-08-31 NOTE — Telephone Encounter (Addendum)
I spoke to the patient and he would like something for the discomfort in his legs.  He had past poor reaction to gabapentin.  Per vo by Dr. Krista Blue, provide rx for Lyrica 50mg , one tablet TID.

## 2019-08-31 NOTE — Telephone Encounter (Signed)
The patient is agreeable and will start Lyrica.

## 2019-09-06 ENCOUNTER — Telehealth: Payer: Self-pay

## 2019-09-06 NOTE — Telephone Encounter (Signed)
Patient called and would like to know is it safe for him to Donepezil and Lyrica together. Please advise

## 2019-09-06 NOTE — Telephone Encounter (Signed)
I returned the call to the patient.  He is aware that Dr. Krista Blue manages these medications and it is safe for him to take them both.  States he is still having bilateral leg pain despit starting Lyrica 50mg  TID.  He would like to be seen for an earlier appt.  He has been schedule with Dr. Krista Blue on 09/08/2019.

## 2019-09-08 ENCOUNTER — Ambulatory Visit: Payer: Self-pay | Admitting: Neurology

## 2019-09-14 ENCOUNTER — Encounter: Payer: Self-pay | Admitting: Neurology

## 2019-09-14 ENCOUNTER — Ambulatory Visit (INDEPENDENT_AMBULATORY_CARE_PROVIDER_SITE_OTHER): Payer: Medicare PPO | Admitting: Neurology

## 2019-09-14 ENCOUNTER — Telehealth: Payer: Self-pay | Admitting: Neurology

## 2019-09-14 ENCOUNTER — Other Ambulatory Visit: Payer: Self-pay

## 2019-09-14 VITALS — BP 134/66 | HR 97 | Temp 97.7°F | Ht 68.0 in | Wt 182.0 lb

## 2019-09-14 DIAGNOSIS — R269 Unspecified abnormalities of gait and mobility: Secondary | ICD-10-CM

## 2019-09-14 DIAGNOSIS — G20 Parkinson's disease: Secondary | ICD-10-CM

## 2019-09-14 DIAGNOSIS — G3184 Mild cognitive impairment, so stated: Secondary | ICD-10-CM

## 2019-09-14 MED ORDER — QUETIAPINE FUMARATE 25 MG PO TABS: 50.0000 mg | ORAL_TABLET | Freq: Every day | ORAL | 4 refills | Status: DC

## 2019-09-14 MED ORDER — PREGABALIN 100 MG PO CAPS: 100.0000 mg | ORAL_CAPSULE | Freq: Four times a day (QID) | ORAL | 5 refills | Status: DC

## 2019-09-14 MED ORDER — CARBIDOPA-LEVODOPA 25-100 MG PO TABS: 2.0000 | ORAL_TABLET | Freq: Three times a day (TID) | ORAL | 4 refills | Status: DC

## 2019-09-14 NOTE — Telephone Encounter (Signed)
Patient lives alone, has niece check on him regularly.  He complains of bilateral lower extremity electricity shock, could not sleep, I have add him on schedule for 230pm today, bring all his medications with him

## 2019-09-14 NOTE — Progress Notes (Signed)
PATIENT: Michael Fitzgerald DOB: Mar 30, 1951  Chief Complaint  Patient presents with  . Parkinsonism/Leg Pain    He is here with his sister.  Reports worsening bilateral leg pain.  He has been taking increased doses of Sinemet (estimates 12-18 tablets per day).     HISTORICAL  Michael Mahabir Hankinsis a 68 year old male, accompanied by his wife, seen in refer by neurosurgeon Dr. Brien Few for evaluation of gait abnormality, initial evaluation was on January 29, 2018.  I reviewed and summarized the referring note, he had a history of depression, anxiety, chronic migraine headache.  Around summer of 2018, he noticed gradual onset bilateral lower extremity especially calf area numbness tingling achy pain, he also noticed gradual onset low back pain, lower extremity weakness, was seen by neurosurgeon Dr. Trenton Gammon, later by pain management Dr. Brien Few, received a few rounds of epidural injection failed to improve his symptoms,  He also had MRIs, MRI of the brain showed no significant abnormality, MRI of lumbar and thoracic spine showed mild degenerative changes, but no significant canal foraminal stenosis to explain his gradual worsening gait abnormality,  He no longer has significant low back pain, denying neck pain, but his gait abnormality gradually getting worse since January 2019, he describes difficulty initiate gait, difficulty turning, there is no hand tremor,  Wife also reported some personality change, mild memory loss,  Laboratory evaluation seen March 2019 showed normal C-reactive protein, CPK, ESR, TSH, folic acid, ANA, RPR, vitamin B12  Electrodiagnostic studyon March 29 2019is normal, there is no evidence of large fiber peripheral neuropathy, lumbosacral radiculopathy.  We have personally reviewed MRI of cervical spine in March 2019, mild multilevel degenerative disc disease, variable degree of foraminal narrowing, there was no evidence of nerve root compression or canal stenosis.   UPDATEApril 15, 2019:  His gait abnormality has much improved with Sinemet 25/100 mg 3 times daily, but complains of GI side effect, he has 7 pound weight loss over the past few weeks  UPDATE Dec 31 2018: Last visit was with [facility] in October 2019, he complains of GI side effect with carbidopa levodopa, also complains of low back pain receiving epidural injection  He is with his daughter Michael Fitzgerald and sister Michael Fitzgerald at today's clinical visit, he had acute worsening over the past few months, increased confusion, gait abnormality, difficulty sleeping, episode of jumping pain throughout his body,  He was seen by emergency room, add on Mirapex 1 mg every night, gabapentin, with no significant change, making very sleepy, seems to be more agitated, confused, take frequent naps during the day, he also has worsening memory loss,   We personally reviewed MRI films, MRI of the brain January 2019, no acute abnormality,, MRI cervical spine March 2019, mild degenerative changes no significant canal or foraminal narrowing.  MRI of thoracic, lumbar spine in 2018, mild degenerative changes no evidence of nerve root compression.  Virtual Visit via Video on Apr 01 2019 He is taking carbidopa levodopa 25/100 mg 2 tablets 3 times a day, which has controlled his Parkinson's symptoms much better, he is tolerating Aricept 10 mg daily, Seroquel 25 mg every night," I have not feel this good for a long time"  UPDATE Sep 14 2019: He is accompanied by his sister at today's clinic visit, he used to take oxycodone 5 mg 3 times a day for chronic low back pain, he stopped it abruptly around September 2020, he went through withdrawal, hallucinations, diarrhea, also develop uncontrollable leg shaking, which is still present  to now, over the past few days, he has been take large dose of Sinemet up to 18 tablets a day, which provide temporary relief, pacing around, taking a hot shower also.  provide temporary relief, he  described uncontrollable bilateral lower extremity jumpy movement, feel like electricity shock, very painful,   REVIEW OF SYSTEMS: Full 14 system review of systems performed and notable only for as above. All other review of systems were negative.  ALLERGIES: Allergies  Allergen Reactions  . Latex Other (See Comments)    Pt. States " makes my skin burn"  . Gabapentin Other (See Comments)    Violent, memory loss, loss of appetite.   . Prednisone     hallucination  . Sildenafil Citrate     Chest pain    HOME MEDICATIONS: Current Outpatient Medications  Medication Sig Dispense Refill  . albuterol (PROVENTIL HFA;VENTOLIN HFA) 108 (90 BASE) MCG/ACT inhaler Inhale into the lungs every 6 (six) hours as needed for wheezing or shortness of breath.    . ALPRAZolam (XANAX) 1 MG tablet Take 1 mg by mouth 3 (three) times daily.    . carbidopa-levodopa (SINEMET IR) 25-100 MG tablet Take 2 tablets by mouth 3 (three) times daily. 540 tablet 4  . donepezil (ARICEPT) 10 MG tablet Take 1 tablet (10 mg total) by mouth at bedtime. 90 tablet 4  . pregabalin (LYRICA) 100 MG capsule Take 1 capsule (100 mg total) by mouth 4 (four) times daily. 120 capsule 5  . QUEtiapine (SEROQUEL) 25 MG tablet Take 2 tablets (50 mg total) by mouth at bedtime. 180 tablet 4   No current facility-administered medications for this visit.     PAST MEDICAL HISTORY: Past Medical History:  Diagnosis Date  . Anxiety 03/25/2011  . Arthritis   . 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 Complication of anesthesia    "wild when wakes up"  . COPD (chronic obstructive pulmonary disease) ([place])    "slight"  . Depression   . Erectile dysfunction 03/25/2011  . GERD (gastroesophageal reflux disease)    rolaids occ  . Hepatitis C 03/25/2011  . Manic depressive disease manic phase ([place])    won't take med  . Night terrors   . Parkinsonism ([place])   . RLS (restless legs syndrome)    . Sleep apnea    no cpap  . Sleep walking     PAST SURGICAL HISTORY: Past Surgical History:  Procedure Laterality Date  . COLONOSCOPY  03/25/2011,16  . 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  Marland Kitchen LIVER BIOPSY     Dr. Arlana Pouch at [facility] over 10 yrs ago.  Marland Kitchen PAROTIDECTOMY Left 05/25/2015  . PAROTIDECTOMY Left 05/25/2015   Procedure: LEFT PAROTIDECTOMY;  Surgeon: Izora Gala, MD;  Location: [facility];  Service: ENT;  Laterality: Left;  . TONSILLECTOMY      FAMILY HISTORY: Family History  Problem Relation Age of Onset  . Colon cancer Mother   . Other Father        unsure of history    SOCIAL HISTORY: Social History   Socioeconomic History  . Marital status: Married    Spouse name: Not on file  . Number of children: 2  . Years of education: 69  . Highest education level: High school graduate  Occupational History  . Occupation: Retired  Scientific laboratory technician  . Financial resource strain: Not on  file  . Food insecurity    Worry: Not on file    Inability: Not on file  . Transportation needs    Medical: Not on file    Non-medical: Not on file  Tobacco Use  . Smoking status: Current Every Day Smoker    Packs/day: 0.50    Years: 30.00    Pack years: 15.00    Types: Cigarettes  . Smokeless tobacco: Never Used  Substance and Sexual Activity  . Alcohol use: No    Comment: quit  . Drug use: Yes    Comment: drugs in past.  Cocaine. 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 2 tatoos on rt lower leg none since age 23.  Marland Kitchen Sexual activity: Not on file  Lifestyle  . Physical activity    Days per week: Not on file    Minutes per session: Not on file  . Stress: Not on file  Relationships  . Social Herbalist on phone: Not on file    Gets together: Not on file    Attends religious service: Not on file    Active member of club or organization: Not on file    Attends meetings of clubs or organizations: Not on file    Relationship status: Not on file  . Intimate  partner violence    Fear of current or ex partner: Not on file    Emotionally abused: Not on file    Physically abused: Not on file    Forced sexual activity: Not on file  Other Topics Concern  . Not on file  Social History Narrative   Lives at home with wife,   Right-handed.   3 cups caffeine per day.     PHYSICAL EXAM   Vitals:   09/14/19 1446  BP: 134/66  Pulse: 97  Temp: 97.7 F (36.5 C)  Weight: 182 lb (82.6 kg)  Height: '5\' 8"'$  (1.727 m)    Not recorded      Body mass index is 27.67 kg/m.  PHYSICAL EXAMNIATION:  Gen: NAD, conversant, well nourised, well groomed                     Cardiovascular: Regular rate rhythm, no peripheral edema, warm, nontender. Eyes: Conjunctivae clear without exudates or hemorrhage Neck: Supple, no carotid bruits. Pulmonary: Clear to auscultation bilaterally   NEUROLOGICAL EXAM:  MENTAL STATUS: Speech:    Speech is normal; fluent and spontaneous with normal comprehension.  Cognition:     Orientation to time, place and person     Normal recent and remote memory     Normal Attention span and concentration     Normal Language, naming, repeating,spontaneous speech     Fund of knowledge   CRANIAL NERVES: CN II: Visual fields are full to confrontation.  Pupils are round equal and briskly reactive to light. CN III, IV, VI: extraocular movement are normal. No ptosis. CN V: Facial sensation is intact to pinprick in all 3 divisions bilaterally. Corneal responses are intact.  CN VII: Face is symmetric with normal eye closure and smile. CN VIII: Hearing is normal to causal conversation. CN IX, X: Palate elevates symmetrically. Phonation is normal. CN XI: Head turning and shoulder shrug are intact CN XII: Tongue is midline with normal movements and no atrophy.  MOTOR: No resting tremor, normal strength, moderate rigidity, limb bradykinesia, left worse than right  REFLEXES: Reflexes are 2+ and symmetric at the biceps, triceps, knees,  and ankles. Plantar responses are  flexor.  SENSORY: Intact to light touch, pinprick, positional sensation and vibratory sensation are intact in fingers and toes.  COORDINATION: Rapid alternating movements and fine finger movements are intact. There is no dysmetria on finger-to-nose and heel-knee-shin.    GAIT/STANCE: He needs push up to get up from seated position, moderate stride, leaning forward, cautious  DIAGNOSTIC DATA (LABS, IMAGING, TESTING) - I reviewed patient records, labs, notes, testing and imaging myself where available.   ASSESSMENT AND PLAN  SOLMON BOHR is a 68 y.o. male   Parkinson's disease Restless leg syndrome  Continue Sinemet 25/100 mg 2 tablets 3 times a day  Laboratory evaluations  Increase Lyrica to 100 mg 4 times a day,  Seroquel 25 mg 2 tablets every night   Marcial Pacas, M.D. Ph.D.  [facility] [address] [place], [place] [address] Ph: [phone] Fax: [phone]  CC: Referring Provider

## 2019-09-14 NOTE — Telephone Encounter (Addendum)
Pt has called to inform that his Parkinson's is worsening.  Pt states he is eating carbidopa-levodopa (SINEMET IR) 25-100 MG tablet like m&m's. Pt states its like electricity is running thru his legs.  Pt was reminded that he has a f/u with NP in November, pt states he can not wait that long.Pt added that he is about ready to check into Hospice and get on a Morphine drip  Pt is urgently asking to be called by RN

## 2019-09-15 ENCOUNTER — Telehealth: Payer: Self-pay | Admitting: Neurology

## 2019-09-15 LAB — COMPREHENSIVE METABOLIC PANEL
ALT: 4 IU/L (ref 0–44)
AST: 12 IU/L (ref 0–40)
Albumin/Globulin Ratio: 1.7 (ref 1.2–2.2)
Albumin: 4.1 g/dL (ref 3.8–4.8)
Alkaline Phosphatase: 101 IU/L (ref 39–117)
BUN/Creatinine Ratio: 14 (ref 10–24)
BUN: 13 mg/dL (ref 8–27)
Bilirubin Total: 0.3 mg/dL (ref 0.0–1.2)
CO2: 22 mmol/L (ref 20–29)
Calcium: 9.1 mg/dL (ref 8.6–10.2)
Chloride: 105 mmol/L (ref 96–106)
Creatinine, Ser: 0.93 mg/dL (ref 0.76–1.27)
GFR calc Af Amer: 97 mL/min/{1.73_m2} (ref >59)
GFR calc non Af Amer: 84 mL/min/{1.73_m2} (ref >59)
Globulin, Total: 2.4 g/dL (ref 1.5–4.5)
Glucose: 124 mg/dL — ABNORMAL HIGH (ref 65–99)
Potassium: 4.4 mmol/L (ref 3.5–5.2)
Sodium: 140 mmol/L (ref 134–144)
Total Protein: 6.5 g/dL (ref 6.0–8.5)

## 2019-09-15 LAB — CBC WITH DIFFERENTIAL
Basophils Absolute: 0.1 10*3/uL (ref 0.0–0.2)
Basos: 1 %
EOS (ABSOLUTE): 0 10*3/uL (ref 0.0–0.4)
Eos: 0 %
Hematocrit: 39.8 % (ref 37.5–51.0)
Hemoglobin: 13.2 g/dL (ref 13.0–17.7)
Immature Grans (Abs): 0.1 10*3/uL (ref 0.0–0.1)
Immature Granulocytes: 1 %
Lymphocytes Absolute: 1.5 10*3/uL (ref 0.7–3.1)
Lymphs: 20 %
MCH: 32.4 pg (ref 26.6–33.0)
MCHC: 33.2 g/dL (ref 31.5–35.7)
MCV: 98 fL — ABNORMAL HIGH (ref 79–97)
Monocytes Absolute: 0.5 10*3/uL (ref 0.1–0.9)
Monocytes: 6 %
Neutrophils Absolute: 5.4 10*3/uL (ref 1.4–7.0)
Neutrophils: 72 %
RBC: 4.08 x10E6/uL — ABNORMAL LOW (ref 4.14–5.80)
RDW: 12.9 % (ref 11.6–15.4)
WBC: 7.6 10*3/uL (ref 3.4–10.8)

## 2019-09-15 LAB — FOLATE: Folate: 10.3 ng/mL (ref >3.0)

## 2019-09-15 LAB — C-REACTIVE PROTEIN: CRP: <1 mg/L (ref 0–10)

## 2019-09-15 LAB — VITAMIN B12: Vitamin B-12: 203 pg/mL — ABNORMAL LOW (ref 232–1245)

## 2019-09-15 LAB — RPR: RPR Ser Ql: NONREACTIVE

## 2019-09-15 LAB — TSH: TSH: 0.378 u[IU]/mL — ABNORMAL LOW (ref 0.450–4.500)

## 2019-09-15 LAB — CK: Total CK: 106 U/L (ref 41–331)

## 2019-09-15 LAB — SEDIMENTATION RATE: Sed Rate: 2 mm/hr (ref 0–30)

## 2019-09-15 NOTE — Telephone Encounter (Addendum)
I was able to speak with his wife, Latanya Laventure (on [place]) and the patient.  They verbalized understanding of the lab results.  He is going to contact [facility], [place] (pt's PCP) to schedule an appt.  He is going to follow up for both his abnormal TSH and B12 with his PCP.  I informed him that I will fax over the lab results and recommended B12 injections.  I did let him know that since Belenda Cruise will be monitoring these conditions, she will instruct him further on other test that may be needed and on his B12 injection schedule.   His results and this conversation have been faxed to [phone] to the attention of Clemmie Krill, [place].

## 2019-09-15 NOTE — Telephone Encounter (Signed)
Left message requesting a call back.

## 2019-09-15 NOTE — Telephone Encounter (Signed)
Please call patient, laboratory evaluation showed decreased TSH 0.378, suggestive of hyperthyroidism, I have forwarded lab results to his primary care physician, he should call his primary care physician for further evaluation of this potential thyroid malfunction  There is also evidence of B12 deficiency, level was was 203, he should start vitamin B12 supplement, 1059micrograms daily for 1 week, weekly for 1 month, then every month.

## 2019-09-16 ENCOUNTER — Telehealth: Payer: Self-pay | Admitting: *Deleted

## 2019-09-16 MED ORDER — PREGABALIN 150 MG PO CAPS: 150.0000 mg | ORAL_CAPSULE | Freq: Three times a day (TID) | ORAL | 5 refills | Status: DC

## 2019-09-16 NOTE — Telephone Encounter (Addendum)
At his last appt, his Lyrica was increased to 100mg  QID.  [employer] will not cover a four time daily dosing.  Per vo by Dr. Krista Blue, provide new rx for Lyrica 150mg , one capsule TID.  New rx will be sent to the pharmacy and all other prescriptions on file will be voided.  [employer] should cover the new dose without a PA.  The patient is aware and agreeable to this change.

## 2019-09-16 NOTE — Telephone Encounter (Signed)
Pt informed us that he has an appt to start his B12 injections on 09/17/2019 (at PCP office).  He will also have a visit to follow up on TSH.

## 2019-09-17 DIAGNOSIS — F419 Anxiety disorder, unspecified: Secondary | ICD-10-CM | POA: Diagnosis not present

## 2019-09-17 DIAGNOSIS — E538 Deficiency of other specified B group vitamins: Secondary | ICD-10-CM | POA: Diagnosis not present

## 2019-09-17 DIAGNOSIS — E079 Disorder of thyroid, unspecified: Secondary | ICD-10-CM | POA: Diagnosis not present

## 2019-09-17 DIAGNOSIS — G2581 Restless legs syndrome: Secondary | ICD-10-CM | POA: Diagnosis not present

## 2019-09-28 ENCOUNTER — Ambulatory Visit: Payer: Self-pay | Admitting: Neurology

## 2019-10-06 NOTE — Progress Notes (Signed)
PATIENT: Michael Fitzgerald DOB: October 11, 1951  REASON FOR VISIT: follow up HISTORY FROM: patient  HISTORY OF PRESENT ILLNESS: Today 10/07/19  HISTORY  Michael Fitzgerald Hankinsis a 68 year old male, accompanied by his wife, seen in refer by neurosurgeon Dr. Brien Fitzgerald for evaluation of gait abnormality, initial evaluation was on January 29, 2018.  I reviewed and summarized the referring note, he had a history of depression, anxiety, chronic migraine headache.  Around summer of 2018, he noticed gradual onset bilateral lower extremity especially calf area numbness tingling achy pain, he also noticed gradual onset low back pain, lower extremity weakness, was seen by neurosurgeon Dr. Trenton Fitzgerald, later by pain management Dr. Brien Fitzgerald, received a Fitzgerald rounds of epidural injection failed to improve his symptoms,  He also had MRIs, MRI of the brain showed no significant abnormality, MRI of lumbar and thoracic spine showed mild degenerative changes, but no significant canal foraminal stenosis to explain his gradual worsening gait abnormality,  He no longer has significant low back pain, denying neck pain, but his gait abnormality gradually getting worse since January 2019, he describes difficulty initiate gait, difficulty turning, there is no hand tremor,  Wife also reported some personality change, mild memory loss,  Laboratory evaluation seen March 2019 showed normal C-reactive protein, CPK, ESR, TSH, folic acid, ANA, RPR, vitamin B12  Electrodiagnostic studyon March 29 2019is normal, there is no evidence of large fiber peripheral neuropathy, lumbosacral radiculopathy.  We have personally reviewed MRI of cervical spine in March 2019, mild multilevel degenerative disc disease, variable degree of foraminal narrowing, there was no evidence of nerve root compression or canal stenosis.  UPDATEApril 15, 2019:  His gait abnormality has much improved with Sinemet 25/100 mg 3 times daily, but complains of GI  side effect, he has 7 pound weight loss over the past Fitzgerald weeks  UPDATE Dec 31 2018: Last visit was with [facility] October 2019, he complains of GI side effect with carbidopa levodopa, also complains of low back pain receiving epidural injection  He is with his daughter Michael Fitzgerald and sister Michael Fitzgerald today's clinical visit, he had acute worsening over the past Fitzgerald months, increased confusion, gait abnormality, difficulty sleeping, episode of jumping pain throughout his body,  He was seen by emergency room, add on Mirapex 1 mg every night, gabapentin, with no significant change, making very sleepy, seems to be more agitated, confused, take frequent naps during the day, he also has worsening memory loss,  We personally reviewed MRI films,MRI of the brain January 2019, no acute abnormality,, MRI cervical spine March 2019, mild degenerative changes no significant canal or foraminal narrowing.  MRI of thoracic, lumbar spine in 2018, mild degenerative changes no evidence of nerve root compression.  Virtual Visit via Video on Apr 01 2019 He is taking carbidopa levodopa 25/100 mg 2 tablets 3 times a day, which has controlled his Parkinson's symptoms much better, he is tolerating Aricept 10 mg daily, Seroquel 25 mg every night," I have not feel this good for a long time"  UPDATE Sep 14 2019: He is accompanied by his sister at today's clinic visit, he used to take oxycodone 5 mg 3 times a day for chronic low back pain, he stopped it abruptly around September 2020, he went through withdrawal, hallucinations, diarrhea, also develop uncontrollable leg shaking, which is still present to now, over the past Fitzgerald days, he has been take large dose of Sinemet up to 18 tablets a day, which provide temporary relief, pacing around, taking a hot shower  also.  provide temporary relief, he described uncontrollable bilateral lower extremity jumpy movement, feel like electricity shock, very painful,  Update October 07, 2019 SS: After last visit, he was to continue Sinemet 25/100 mg 2 tablets 3 times a day, increase Lyrica 150 mg 3 times a day, Seroquel 25 mg, 2 tablets at bedtime.  Laboratory evaluation in October 2020 showed decreased TSH 0.378, suggestive hyperthyroidism, B12 low 203  Today, he is here with his sister, indicates continued low back pain, pain in his legs, legs jerking.  He feels he is not getting enough Sinemet, he cannot wait till next dose, feels that he cannot move, feels he is freezing.  He says when he takes Sinemet everything is better, his movement and the jerking. He is supposed to take his medicine at 6, 12, 6.  He had a 2nd opinion at [facility], was not felt to have Parkinson's, it was thought he has essential tremor. He was started on primidone.  He had MRI of the brain in October 2020, no acute abnormality was found.  He is now taking primidone, which does help with the tremor.  Before when he was taking oxycodone, he was walking, doing well, since off medication, is using wheelchair.  REVIEW OF SYSTEMS: Out of a complete 14 system review of symptoms, the patient complains only of the following symptoms, and all other reviewed systems are negative.  Tremor, weakness, dizziness  ALLERGIES: Allergies  Allergen Reactions  . Latex Other (See Comments)    Pt. States " makes my skin burn"  . Gabapentin Other (See Comments)    Violent, memory loss, loss of appetite.   . Prednisone     hallucination  . Sildenafil Citrate     Chest pain    HOME MEDICATIONS: Outpatient Medications Prior to Visit  Medication Sig Dispense Refill  . albuterol (PROVENTIL HFA;VENTOLIN HFA) 108 (90 BASE) MCG/ACT inhaler Inhale into the lungs every 6 (six) hours as needed for wheezing or shortness of breath.    . ALPRAZolam (XANAX) 1 MG tablet Take 1 mg by mouth 3 (three) times daily.    Marland Kitchen donepezil (ARICEPT) 10 MG tablet Take 1 tablet (10 mg total) by mouth at bedtime. 90 tablet 4  .  pregabalin (LYRICA) 150 MG capsule Take 1 capsule (150 mg total) by mouth 3 (three) times daily. 90 capsule 5  . primidone (MYSOLINE) 50 MG tablet     . QUEtiapine (SEROQUEL) 25 MG tablet Take 2 tablets (50 mg total) by mouth at bedtime. 180 tablet 4  . carbidopa-levodopa (SINEMET IR) 25-100 MG tablet Take 2 tablets by mouth 3 (three) times daily. 540 tablet 4   No facility-administered medications prior to visit.     PAST MEDICAL HISTORY: Past Medical History:  Diagnosis Date  . Anxiety 03/25/2011  . Arthritis   . 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 Complication of anesthesia    "wild when wakes up"  . COPD (chronic obstructive pulmonary disease) ([place])    "slight"  . Depression   . Erectile dysfunction 03/25/2011  . GERD (gastroesophageal reflux disease)    rolaids occ  . Hepatitis C 03/25/2011  . 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 Night terrors   . Parkinsonism ([place])   . RLS (restless legs syndrome)   . Sleep apnea    no cpap  . Sleep  walking     PAST SURGICAL HISTORY: Past Surgical History:  Procedure Laterality Date  . COLONOSCOPY  03/25/2011,16  . 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  Marland Kitchen LIVER BIOPSY     Dr. Arlana Pouch at [facility] over 10 yrs ago.  Marland Kitchen PAROTIDECTOMY Left 05/25/2015  . PAROTIDECTOMY Left 05/25/2015   Procedure: LEFT PAROTIDECTOMY;  Surgeon: Izora Gala, MD;  Location: [facility];  Service: ENT;  Laterality: Left;  . TONSILLECTOMY      FAMILY HISTORY: Family History  Problem Relation Age of Onset  . Colon cancer Mother   . Other Father        unsure of history    SOCIAL HISTORY: Social History   Socioeconomic History  . Marital status: Married    Spouse name: Not on file  . Number of children: 2  . Years of education: 13  . Highest education level: High school graduate  Occupational History  . Occupation: Retired  Scientific laboratory technician  .  Financial resource strain: Not on file  . Food insecurity    Worry: Not on file    Inability: Not on file  . Transportation needs    Medical: Not on file    Non-medical: Not on file  Tobacco Use  . Smoking status: Current Every Day Smoker    Packs/day: 0.50    Years: 30.00    Pack years: 15.00    Types: Cigarettes  . Smokeless tobacco: Never Used  Substance and Sexual Activity  . Alcohol use: No    Comment: quit  . Drug use: Yes    Comment: drugs in past.  Cocaine. 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 2 tatoos on rt lower leg none since age 28.  Marland Kitchen Sexual activity: Not on file  Lifestyle  . Physical activity    Days per week: Not on file    Minutes per session: Not on file  . Stress: Not on file  Relationships  . Social Herbalist on phone: Not on file    Gets together: Not on file    Attends religious service: Not on file    Active member of club or organization: Not on file    Attends meetings of clubs or organizations: Not on file    Relationship status: Not on file  . Intimate partner violence    Fear of current or ex partner: Not on file    Emotionally abused: Not on file    Physically abused: Not on file    Forced sexual activity: Not on file  Other Topics Concern  . Not on file  Social History Narrative   Lives at home with wife,   Right-handed.   3 cups caffeine per day.   PHYSICAL EXAM  Vitals:   10/07/19 1511  BP: 135/87  Pulse: 84  Temp: (!) 97.5 F (36.4 C)  Weight: 183 lb 6.4 oz (83.2 kg)  Height: '5\' 6"'$  (1.676 m)   Body mass index is 29.6 kg/m.  Generalized: Well developed, in no acute distress   Neurological examination  Mentation: Alert oriented to time, place, history taking. Follows all commands speech and language fluent Cranial nerve II-XII: Pupils were equal round reactive to light. Extraocular movements were full, visual field were full on confrontational test. Facial sensation and strength were normal.  Head turning and shoulder shrug   were normal and symmetric. Motor: Weakness to lower extremities, strong upper extremities, limb bradykinesia lower extremities, normal upper.  No rigidity, or resting tremor noted. Sensory: Sensory testing is intact to soft touch on all 4 extremities. No evidence of extinction is noted.  Coordination: Cerebellar testing reveals good finger-nose-finger bilaterally Gait and station: Shuffling gait, slow, cautious, forward leaning  DIAGNOSTIC DATA (LABS, IMAGING, TESTING) - I reviewed patient records, labs, notes, testing and imaging myself where available.  Lab Results  Component Value Date   WBC 7.6 09/14/2019   HGB 13.2 09/14/2019   HCT 39.8 09/14/2019   MCV 98 (H) 09/14/2019   PLT 193 05/17/2015      Component Value Date/Time   NA 140 09/14/2019 1521   K 4.4 09/14/2019 1521   CL 105 09/14/2019 1521   CO2 22 09/14/2019 1521   GLUCOSE 124 (H) 09/14/2019 1521   GLUCOSE 96 05/17/2015 1512   BUN 13 09/14/2019 1521   CREATININE 0.93 09/14/2019 1521   CALCIUM 9.1 09/14/2019 1521   PROT 6.5 09/14/2019 1521   ALBUMIN 4.1 09/14/2019 1521   AST 12 09/14/2019 1521   ALT 4 09/14/2019 1521   ALKPHOS 101 09/14/2019 1521   BILITOT 0.3 09/14/2019 1521   GFRNONAA 84 09/14/2019 1521   GFRAA 97 09/14/2019 1521   No results found for: CHOL, HDL, LDLCALC, LDLDIRECT, TRIG, CHOLHDL No results found for: HGBA1C Lab Results  Component Value Date   VITAMINB12 203 (L) 09/14/2019   Lab Results  Component Value Date   TSH 0.378 (L) 09/14/2019   ASSESSMENT AND PLAN 68 y.o. 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  here with:  1.  Parkinson's disease 2.  Restless leg syndrome -Feels he is freezing, stiff, shuffling gait, has all worsen significantly since he took himself off oxycodone, he does exhibit shuffling gait, weakness/bradykinesia lower extremities -Increase Sinemet 25/100, 2 tablets 4 times a day -I will order DAT Scan, patient sought second opinion at [facility] neurology, Dr. Bjorn Loser, was not felt to have Parkinson's, felt to have symptoms more consistent with essential tremor, he is now taking primidone, he feels Sinemet makes everything better -Depending on results of the DAT Scan, may refer to Dr. Linus Mako at [place] 150 mg 3 times a day -Continue Seroquel 25 mg, 2 tablets at bedtime -Follow-up in 3 months with Dr. Krista Blue or sooner if needed  I spent 25 minutes with the patient. 50% of this time was spent discussing his plan of care.  Butler Denmark, AGNP-C, DNP 10/07/2019, 4:18 PM [facility] [address], [place], [place] [address] [phone]

## 2019-10-07 ENCOUNTER — Other Ambulatory Visit: Payer: Self-pay

## 2019-10-07 ENCOUNTER — Encounter: Payer: Self-pay | Admitting: Neurology

## 2019-10-07 ENCOUNTER — Ambulatory Visit: Payer: Medicare PPO | Admitting: Neurology

## 2019-10-07 VITALS — BP 135/87 | HR 84 | Temp 97.5°F | Ht 66.0 in | Wt 183.4 lb

## 2019-10-07 DIAGNOSIS — G20 Parkinson's disease: Secondary | ICD-10-CM

## 2019-10-07 DIAGNOSIS — R269 Unspecified abnormalities of gait and mobility: Secondary | ICD-10-CM

## 2019-10-07 MED ORDER — CARBIDOPA-LEVODOPA 25-100 MG PO TABS: 2.0000 | ORAL_TABLET | Freq: Four times a day (QID) | ORAL | 0 refills | Status: DC

## 2019-10-07 MED ORDER — CARBIDOPA-LEVODOPA 25-100 MG PO TABS: 2.0000 | ORAL_TABLET | Freq: Four times a day (QID) | ORAL | 4 refills | Status: DC

## 2019-10-07 NOTE — Patient Instructions (Signed)
Increase Sinemet 25/100 2 tablets 4 times a day, I will order DAT Scan.

## 2019-10-11 ENCOUNTER — Telehealth: Payer: Self-pay | Admitting: Neurology

## 2019-10-11 DIAGNOSIS — R269 Unspecified abnormalities of gait and mobility: Secondary | ICD-10-CM

## 2019-10-11 NOTE — Telephone Encounter (Signed)
Hey Mr. Ingrum  Please signed highlighted areas and mail back to. Attention Rance Muir.   Thanks, I will call you when you DAT Scan is ready to be scheduled.   Hinton Dyer [phone]

## 2019-10-13 ENCOUNTER — Telehealth: Payer: Self-pay | Admitting: Neurology

## 2019-10-13 NOTE — Telephone Encounter (Signed)
Pt called wanting to discuss with provider his carbidopa-levodopa (SINEMET IR) 25-100 MG tablet Please advise.

## 2019-10-13 NOTE — Telephone Encounter (Signed)
I called pt and he states that his nerve pain was so back, excruiating (back and leg pain) that he ended up taking some left over oxycodone 5mg  tabs that he had left (from a prescription that he had from Dr. Trenton Gammon).  He stated that helped this pain and he slept for 7 hours straight.  He is taking CL 25/100 2 tabs QID, lyrica 150mg  po TID. I relayed that your CL is for tremors, lyrica is for nerve pain.  We do not prescribe narcotics.  I told him that I would relay to SS/NP and call back with recommendations.

## 2019-10-13 NOTE — Telephone Encounter (Signed)
I called the patient. He reports he took some left over oxycodone last night and it helped tremendously for his back and leg pain. He felt so good. He thinks he might need to be back on it, which may be the case. Dr. Trenton Gammon has prescribed in the past. He is going to talk with Dr. Trenton Gammon or his primary doctor to see if they will prescribe oxycodone again. He is pending DAT Scan.

## 2019-10-18 DIAGNOSIS — F419 Anxiety disorder, unspecified: Secondary | ICD-10-CM | POA: Diagnosis not present

## 2019-10-18 DIAGNOSIS — G2581 Restless legs syndrome: Secondary | ICD-10-CM | POA: Diagnosis not present

## 2019-10-19 NOTE — Addendum Note (Signed)
Addended by: Suzzanne Cloud on: 10/19/2019 04:33 PM   Modules accepted: Orders

## 2019-10-19 NOTE — Telephone Encounter (Signed)
I called the patient.  He was crying complaining of constant low back pain, nerve pain radiating down both legs, leg jerking.  He indicates the pain is worse at night, the jerking wakes him up.  He wishes he had never come off oxycodone.  He is taking Sinemet every 5 hours, but he feels helps with the pain.  The pain is significant to where he feels he cannot walk, spasms, electricity down his legs.  He says Dr. Trenton Gammon will not prescribe medication, but has made him mad.  He is currently taking Lyrica 150 mg 3 times a day.  He wants a prescription for oxycodone.  I will discuss case with Dr. Krista Blue, will call the patient back to update.

## 2019-10-19 NOTE — Telephone Encounter (Signed)
I called the patient. I will place referral for pain clinic for chronic back pain. He is seeing his primary care doctor tomorrow.  I reviewed Dr. Rhea Belton initial note 01/29/2018, indicated MRI of his lumbar and thoracic spine showed mild degenerative changes, no significant canal foraminal stenosis to explain gradual worsening gait abnormality.

## 2019-10-19 NOTE — Telephone Encounter (Signed)
Called and spoke to Mr.Eastland about DAT Scan . I am waiting on Patient to Mail me back the consent so I can process to insurance.   Mr. Duch was crying on the phone and relayed he was in so much pain. Patient states he is only getting 4 hours of sleep and he lives alone. Patient states that he just wants to take a gun a shot himself.  I relayed to Mr. Kumer to please call 911 if he needed to.   Patient stated oxycodone was really helping him but he can't call his PCP back because he Dr. Trenton Gammon mad.

## 2019-10-19 NOTE — Telephone Encounter (Signed)
We will not write oxycodone, I did not see previous workup for his low back pain.  If he complains of significant low back pain, has not had MRI  recently, we can offer MRI of lumbar spine

## 2019-10-20 DIAGNOSIS — Z720 Tobacco use: Secondary | ICD-10-CM | POA: Diagnosis not present

## 2019-10-20 DIAGNOSIS — M545 Low back pain: Secondary | ICD-10-CM | POA: Diagnosis not present

## 2019-10-20 DIAGNOSIS — G2581 Restless legs syndrome: Secondary | ICD-10-CM | POA: Diagnosis not present

## 2019-10-20 DIAGNOSIS — F419 Anxiety disorder, unspecified: Secondary | ICD-10-CM | POA: Diagnosis not present

## 2019-10-20 DIAGNOSIS — G20 Parkinson's disease: Secondary | ICD-10-CM | POA: Diagnosis not present

## 2019-10-20 DIAGNOSIS — Z6828 Body mass index (BMI) 28.0-28.9, adult: Secondary | ICD-10-CM | POA: Diagnosis not present

## 2019-10-20 NOTE — Telephone Encounter (Signed)
Patient called to check in; in regards to his back pain and states that his leg has been jerking and believes it's due to his parkinson's  Patient also states that he has not received the DAT [facility] authorization forms.  Please follow up. Marland Kitchen

## 2019-10-20 NOTE — Telephone Encounter (Signed)
Patient called back stating he would also like to FU in regards to his carbidopa-levodopa (SINEMET IR) 25-100 MG tablet. Please follow up

## 2019-10-25 MED ORDER — CARBIDOPA-LEVODOPA 25-100 MG PO TABS: 2.0000 | ORAL_TABLET | Freq: Four times a day (QID) | ORAL | 0 refills | Status: DC

## 2019-10-25 NOTE — Telephone Encounter (Signed)
He should not have a sudden withdraw from SInemet, it is OK to call in just enough medication until his next shipment arrive

## 2019-10-25 NOTE — Telephone Encounter (Signed)
Please call the patient. He is to be taking Sinemet 25/100 2 tablets 4 times a day. He is pending DAT Scan for PD. I have placed a referral for pain management.   We do not prescribe narcotics. He was going to see his PCP, last time I talked with him.   I am not sure what he means by "a few pills to help him ease the pain".   I have sent in plenty of Sinemet 10/07/2019, sent 200 tablets to local pharmacy, until his mail order supply arrived (700 tablets, 4 refills).  If new back pain problem, can recheck the lumbar spine, but MRI March 2019, showed mild degenerative changes, no significant canal or foraminal stenosis to explain gradual worsening or gait abnormality  He tells me Dr. Trenton Gammon prescribed oxycodone previously, but that he can't go back there because he made him mad. Did he call Dr. Trenton Gammon to discuss his current situation?

## 2019-10-25 NOTE — Telephone Encounter (Signed)
I spoke to pt and relayed that I called [employer] and they are tracked to deliver tomorrow. I relayed to pt that will call in 2 days (8 tablets/ day) to [facility] local pharmacy ASAP for him.  He was appreciative.

## 2019-10-25 NOTE — Addendum Note (Signed)
Addended by: Brandon Melnick on: 10/25/2019 02:39 PM   Modules accepted: Orders

## 2019-10-25 NOTE — Telephone Encounter (Signed)
Pt has called in stating he has taken too many of the carbidopa-levodopa (SINEMET IR) 25-100 MG tablet and he is out.  Pt states he has learned his lesson and is wanting to talk to Judson Roch about getting more.  Pt states he can not tolerate the pain.  Please call

## 2019-10-25 NOTE — Telephone Encounter (Signed)
Noted. Thanks for coordinating.

## 2019-10-25 NOTE — Telephone Encounter (Signed)
Pt called in hysterical and in tears and kept saying please please please he is in so much pain , he states he cant take the pain he wants a few of the pills to help him ease the pain.

## 2019-10-25 NOTE — Telephone Encounter (Signed)
Patient mentioned that he has taken too many medication. He stated that he only has 2 or 3 tablets left. He mentioned that he hasn't gotten any sleep last night. He stated that he would take the medication as prescribed, when asked how many pills did he take he became upset over phone. He stated that he would like a new prescription. Please advise.

## 2019-10-25 NOTE — Telephone Encounter (Signed)
I called pt and he states that he is needing sinemet tabs.  He is asking for 3 days worth.  He is taking 10 tabs per day.  He did not want norcotics.

## 2019-10-25 NOTE — Telephone Encounter (Signed)
I called [employer].  They relayed sent out on 10-23-19, tracking states to arrive with pt tomorrow.  Please advise if to give him sinemet till arrives.

## 2019-10-26 DIAGNOSIS — R5383 Other fatigue: Secondary | ICD-10-CM | POA: Diagnosis not present

## 2019-11-03 NOTE — Progress Notes (Signed)
I have reviewed and agreed above plan. 

## 2019-11-10 DIAGNOSIS — G20 Parkinson's disease: Secondary | ICD-10-CM | POA: Diagnosis not present

## 2019-11-10 DIAGNOSIS — G3184 Mild cognitive impairment, so stated: Secondary | ICD-10-CM | POA: Diagnosis not present

## 2019-11-10 DIAGNOSIS — Z6829 Body mass index (BMI) 29.0-29.9, adult: Secondary | ICD-10-CM | POA: Diagnosis not present

## 2019-11-16 ENCOUNTER — Telehealth: Payer: Self-pay | Admitting: Neurology

## 2019-11-16 NOTE — Telephone Encounter (Signed)
Form was received and now its in pending mode with insurance this could take two weeks. Patient will be notified   of approval status

## 2019-11-16 NOTE — Telephone Encounter (Signed)
Patient called to check on the status of his DAT scan appt and if authorization forms where received. Please follow up

## 2019-11-18 DIAGNOSIS — G20 Parkinson's disease: Secondary | ICD-10-CM | POA: Diagnosis not present

## 2019-11-18 DIAGNOSIS — G2581 Restless legs syndrome: Secondary | ICD-10-CM | POA: Diagnosis not present

## 2019-11-25 NOTE — Telephone Encounter (Signed)
Patient has been approved for DAT scan I have sent for scheduling . Patient is aware In talked to him this morning.

## 2019-12-06 NOTE — Telephone Encounter (Signed)
Pt called wanting to know when he can go ahead and be scheduled for this DAT Scan. Please advise.

## 2019-12-09 DIAGNOSIS — Z720 Tobacco use: Secondary | ICD-10-CM | POA: Diagnosis not present

## 2019-12-09 DIAGNOSIS — R5383 Other fatigue: Secondary | ICD-10-CM | POA: Diagnosis not present

## 2019-12-09 DIAGNOSIS — Z13220 Encounter for screening for lipoid disorders: Secondary | ICD-10-CM | POA: Diagnosis not present

## 2019-12-09 DIAGNOSIS — G2581 Restless legs syndrome: Secondary | ICD-10-CM | POA: Diagnosis not present

## 2019-12-09 NOTE — Telephone Encounter (Signed)
I called the patient back to let him know the order had been sent over. The patient is frustrated because he says he has called several times. I informed him that he spoke with Hinton Dyer a few weeks ago, patient stated that he has not spoken with anyone in our office and that he keeps calling. I read the note Hinton Dyer has below and he states that he does remember that conversation but he has not been called to be scheduled. I told him I would reach out to [facility] scheduling and see if she could schedule him. I spoke with Lovena Le who stated she would call the patient within 30 minutes, they never received the order. I called the patient back and made him aware.

## 2019-12-09 NOTE — Telephone Encounter (Signed)
Pt. Has called again stating he has been ready to schedule his DAT Scan. Please advise.

## 2019-12-13 DIAGNOSIS — Z6827 Body mass index (BMI) 27.0-27.9, adult: Secondary | ICD-10-CM | POA: Diagnosis not present

## 2019-12-13 DIAGNOSIS — G2581 Restless legs syndrome: Secondary | ICD-10-CM | POA: Diagnosis not present

## 2019-12-13 DIAGNOSIS — G20 Parkinson's disease: Secondary | ICD-10-CM | POA: Diagnosis not present

## 2019-12-13 DIAGNOSIS — R55 Syncope and collapse: Secondary | ICD-10-CM | POA: Diagnosis not present

## 2019-12-13 DIAGNOSIS — Z125 Encounter for screening for malignant neoplasm of prostate: Secondary | ICD-10-CM | POA: Diagnosis not present

## 2019-12-13 DIAGNOSIS — Z0000 Encounter for general adult medical examination without abnormal findings: Secondary | ICD-10-CM | POA: Diagnosis not present

## 2019-12-13 DIAGNOSIS — G3184 Mild cognitive impairment, so stated: Secondary | ICD-10-CM | POA: Diagnosis not present

## 2019-12-13 DIAGNOSIS — Z79899 Other long term (current) drug therapy: Secondary | ICD-10-CM | POA: Diagnosis not present

## 2019-12-14 DIAGNOSIS — Z6828 Body mass index (BMI) 28.0-28.9, adult: Secondary | ICD-10-CM | POA: Diagnosis not present

## 2019-12-14 DIAGNOSIS — G20 Parkinson's disease: Secondary | ICD-10-CM | POA: Diagnosis not present

## 2019-12-14 DIAGNOSIS — M545 Low back pain: Secondary | ICD-10-CM | POA: Diagnosis not present

## 2019-12-14 DIAGNOSIS — F419 Anxiety disorder, unspecified: Secondary | ICD-10-CM | POA: Diagnosis not present

## 2019-12-14 DIAGNOSIS — Z720 Tobacco use: Secondary | ICD-10-CM | POA: Diagnosis not present

## 2019-12-14 DIAGNOSIS — Z0000 Encounter for general adult medical examination without abnormal findings: Secondary | ICD-10-CM | POA: Diagnosis not present

## 2019-12-14 DIAGNOSIS — G2581 Restless legs syndrome: Secondary | ICD-10-CM | POA: Diagnosis not present

## 2019-12-14 NOTE — Telephone Encounter (Signed)
Patient is scheduled for his DAT scan 12/22/2018 . Patient is aware.

## 2019-12-17 DIAGNOSIS — F419 Anxiety disorder, unspecified: Secondary | ICD-10-CM | POA: Diagnosis not present

## 2019-12-17 DIAGNOSIS — I10 Essential (primary) hypertension: Secondary | ICD-10-CM | POA: Diagnosis not present

## 2019-12-21 DIAGNOSIS — Z136 Encounter for screening for cardiovascular disorders: Secondary | ICD-10-CM | POA: Diagnosis not present

## 2019-12-21 DIAGNOSIS — Z87891 Personal history of nicotine dependence: Secondary | ICD-10-CM | POA: Diagnosis not present

## 2019-12-23 ENCOUNTER — Ambulatory Visit (HOSPITAL_COMMUNITY)
Admission: RE | Admit: 2019-12-23 | Discharge: 2019-12-23 | Disposition: A | Discharge status: Home / Self Care | Payer: Medicare PPO | Source: Ambulatory Visit | Attending: Neurology | Admitting: Neurology

## 2019-12-23 ENCOUNTER — Other Ambulatory Visit: Payer: Self-pay

## 2019-12-23 DIAGNOSIS — G20 Parkinson's disease: Secondary | ICD-10-CM | POA: Insufficient documentation

## 2019-12-23 DIAGNOSIS — R251 Tremor, unspecified: Secondary | ICD-10-CM | POA: Diagnosis not present

## 2019-12-23 DIAGNOSIS — R269 Unspecified abnormalities of gait and mobility: Secondary | ICD-10-CM | POA: Diagnosis not present

## 2019-12-23 MED ORDER — IODINE STRONG (LUGOLS) 5 % PO SOLN
0.8000 mL | Freq: Once | ORAL | Status: AC
  Administered 2019-12-23: 0.8 mL via ORAL

## 2019-12-23 MED ORDER — IOFLUPANE I 123 185 MBQ/2.5ML IV SOLN
5.0000 | Freq: Once | INTRAVENOUS | Status: AC
  Administered 2019-12-23: 5 via INTRAVENOUS

## 2019-12-27 ENCOUNTER — Telehealth: Payer: Self-pay | Admitting: Neurology

## 2019-12-27 NOTE — Telephone Encounter (Signed)
I called the patient.  DaTscan showed evidence of dopamine deficiency, consistent with Parkinson's disease.  He will remain on Sinemet, keep follow-up appointment 01/10/2020 with Dr. Krista Blue to discuss. He reports episode of syncope, low blood sugar today.  I advised him to call his primary doctor to discuss.  He requested we send copy of the DaTscan to his primary doctor.  DAT Scan IMPRESSION: Asymmetric bilateral reduction in radiotracer activity within the posterior striata (RIGHT greater than LEFT) is a pattern typical of Parkinson's syndrome pathology.

## 2019-12-27 NOTE — Telephone Encounter (Signed)
Fax confirmation received DAT scan and phone note with results.  To pcp [phone], [phone].

## 2019-12-28 DIAGNOSIS — R55 Syncope and collapse: Secondary | ICD-10-CM | POA: Diagnosis not present

## 2019-12-28 DIAGNOSIS — Z6828 Body mass index (BMI) 28.0-28.9, adult: Secondary | ICD-10-CM | POA: Diagnosis not present

## 2019-12-28 DIAGNOSIS — G20 Parkinson's disease: Secondary | ICD-10-CM | POA: Diagnosis not present

## 2020-01-10 ENCOUNTER — Encounter: Payer: Self-pay | Admitting: Neurology

## 2020-01-10 ENCOUNTER — Other Ambulatory Visit: Payer: Self-pay

## 2020-01-10 ENCOUNTER — Ambulatory Visit: Payer: Medicare PPO | Admitting: Neurology

## 2020-01-10 VITALS — BP 148/87 | HR 72 | Temp 97.5°F | Ht 66.0 in | Wt 186.5 lb

## 2020-01-10 DIAGNOSIS — G20 Parkinson's disease: Secondary | ICD-10-CM

## 2020-01-10 DIAGNOSIS — R413 Other amnesia: Secondary | ICD-10-CM | POA: Diagnosis not present

## 2020-01-10 DIAGNOSIS — R269 Unspecified abnormalities of gait and mobility: Secondary | ICD-10-CM

## 2020-01-10 MED ORDER — PREGABALIN 150 MG PO CAPS: ORAL_CAPSULE | ORAL | 5 refills | Status: DC

## 2020-01-10 MED ORDER — ROPINIROLE HCL 1 MG PO TABS: 2.0000 mg | ORAL_TABLET | Freq: Every day | ORAL | 11 refills | Status: DC

## 2020-01-10 NOTE — Patient Instructions (Signed)
Stop Primidone

## 2020-01-10 NOTE — Progress Notes (Addendum)
PATIENT: Michael Fitzgerald DOB: 23-Oct-1951  Chief Complaint  Patient presents with  . Parkinson's Disease    MMSE 30/30 - 13 animals. He is here with his wife, Michael Fitzgerald. They would like to discuss the results of his DaTscan.     HISTORICAL  Michael Fells Hankinsis a 69 year old male, accompanied by his wife, seen in refer by neurosurgeon Dr. Brien Few for evaluation of gait abnormality, initial evaluation was on January 29, 2018.  I reviewed and summarized the referring note, he had a history of depression, anxiety, chronic migraine headache.  Around summer of 2018, he noticed gradual onset bilateral lower extremity especially calf area numbness tingling achy pain, he also noticed gradual onset low back pain, lower extremity weakness, was seen by neurosurgeon Dr. Trenton Gammon, later by pain management Dr. Brien Few, received a few rounds of epidural injection failed to improve his symptoms,  He also had MRIs, MRI of the brain showed no significant abnormality, MRI of lumbar and thoracic spine showed mild degenerative changes, but no significant canal foraminal stenosis to explain his gradual worsening gait abnormality,  He no longer has significant low back pain, denying neck pain, but his gait abnormality gradually getting worse since January 2019, he describes difficulty initiate gait, difficulty turning, there is no hand tremor,  Wife also reported some personality change, mild memory loss,  Laboratory evaluation seen March 2019 showed normal C-reactive protein, CPK, ESR, TSH, folic acid, ANA, RPR, vitamin B12  Electrodiagnostic studyon March 29 2019is normal, there is no evidence of large fiber peripheral neuropathy, lumbosacral radiculopathy.  We have personally reviewed MRI of cervical spine in March 2019, mild multilevel degenerative disc disease, variable degree of foraminal narrowing, there was no evidence of nerve root compression or canal stenosis.  UPDATEApril 15, 2019:  His gait  abnormality has much improved with Sinemet 25/100 mg 3 times daily, but complains of GI side effect, he has 7 pound weight loss over the past few weeks  UPDATE Dec 31 2018: Last visit was with [facility] in October 2019, he complains of GI side effect with carbidopa levodopa, also complains of low back pain receiving epidural injection  He is with his daughter Michael Fitzgerald and sister Michael Fitzgerald at today's clinical visit, he had acute worsening over the past few months, increased confusion, gait abnormality, difficulty sleeping, episode of jumping pain throughout his body,  He was seen by emergency room, add on Mirapex 1 mg every night, gabapentin, with no significant change, making very sleepy, seems to be more agitated, confused, take frequent naps during the day, he also has worsening memory loss,   We personally reviewed MRI films, MRI of the brain January 2019, no acute abnormality,, MRI cervical spine March 2019, mild degenerative changes no significant canal or foraminal narrowing.  MRI of thoracic, lumbar spine in 2018, mild degenerative changes no evidence of nerve root compression.  Virtual Visit via Video on Apr 01 2019 He is taking carbidopa levodopa 25/100 mg 2 tablets 3 times a day, which has controlled his Parkinson's symptoms much better, he is tolerating Aricept 10 mg daily, Seroquel 25 mg every night," I have not feel this good for a long time"  UPDATE Sep 14 2019: He is accompanied by his sister at today's clinic visit, he used to take oxycodone 5 mg 3 times a day for chronic low back pain, he stopped it abruptly around September 2020, he went through withdrawal, hallucinations, diarrhea, also develop uncontrollable leg shaking, which is still present to now, over the  past few days, he has been take large dose of Sinemet up to 18 tablets a day, which provide temporary relief, pacing around, taking a hot shower also.  provide temporary relief, he described uncontrollable bilateral lower  extremity jumpy movement, feel like electricity shock, very painful,  UPDATE Jan 10 2020: He is overall doing much better  He is currently taking Sinemet 25/100 mg 2 tablets 4 times a day at 6, 12, 6, and 12 AM, Lyrica 150 mg 3 times a day, also Seroquel 25 mg 2 tablets every night, he continue complains of difficulty sleeping, frequent leg jerking movement, bilateral lower extremity muscle tightness, sometimes wake up from sleep due to leg pain, discomfort,  Personally reviewed data scan in February 2021, asymmetric bilateral reduction in radiotracer activity within the posterior striata, right greater than left, in a pattern typical of Parkinson's syndrome pathology   REVIEW OF SYSTEMS: Full 14 system review of systems performed and notable only for as above. All other review of systems were negative.  ALLERGIES: Allergies  Allergen Reactions  . Latex Other (See Comments)    Pt. States " makes my skin burn"  . Gabapentin Other (See Comments)    Violent, memory loss, loss of appetite.   . Prednisone     hallucination  . Sildenafil Citrate     Chest pain    HOME MEDICATIONS: Current Outpatient Medications  Medication Sig Dispense Refill  . albuterol (PROVENTIL HFA;VENTOLIN HFA) 108 (90 BASE) MCG/ACT inhaler Inhale into the lungs every 6 (six) hours as needed for wheezing or shortness of breath.    . ALPRAZolam (XANAX) 1 MG tablet Take 1 mg by mouth 3 (three) times daily.    . carbidopa-levodopa (SINEMET IR) 25-100 MG tablet Take 2 tablets by mouth 4 (four) times daily. 700 tablet 4  . donepezil (ARICEPT) 10 MG tablet Take 1 tablet (10 mg total) by mouth at bedtime. 90 tablet 4  . midodrine (PROAMATINE) 5 MG tablet Take 5 mg by mouth 2 (two) times daily.    . pregabalin (LYRICA) 150 MG capsule Take 1 capsule (150 mg total) by mouth 3 (three) times daily. 90 capsule 5  . primidone (MYSOLINE) 50 MG tablet     . QUEtiapine (SEROQUEL) 25 MG tablet Take 2 tablets (50 mg total) by mouth  at bedtime. 180 tablet 4   No current facility-administered medications for this visit.    PAST MEDICAL HISTORY: Past Medical History:  Diagnosis Date  . Anxiety 03/25/2011  . Arthritis   . 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 Complication of anesthesia    "wild when wakes up"  . COPD (chronic obstructive pulmonary disease) ([place])    "slight"  . Depression   . Erectile dysfunction 03/25/2011  . GERD (gastroesophageal reflux disease)    rolaids occ  . Hepatitis C 03/25/2011  . Manic depressive disease manic phase ([place])    won't take med  . Night terrors   . Parkinsonism ([place])   . RLS (restless legs syndrome)   . Sleep apnea    no cpap  . Sleep walking     PAST SURGICAL HISTORY: Past Surgical History:  Procedure Laterality Date  . COLONOSCOPY  03/25/2011,16  . 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  Marland Kitchen LIVER BIOPSY     Dr. Arlana Pouch at [facility] over 10 yrs ago.  Marland Kitchen PAROTIDECTOMY Left  05/25/2015  . PAROTIDECTOMY Left 05/25/2015   Procedure: LEFT PAROTIDECTOMY;  Surgeon: Izora Gala, MD;  Location: [facility];  Service: ENT;  Laterality: Left;  . TONSILLECTOMY      FAMILY HISTORY: Family History  Problem Relation Age of Onset  . Colon cancer Mother   . Other Father        unsure of history    SOCIAL HISTORY: Social History   Socioeconomic History  . Marital status: Married    Spouse name: Not on file  . Number of children: 2  . Years of education: 50  . Highest education level: High school graduate  Occupational History  . Occupation: Retired  Tobacco Use  . Smoking status: Current Every Day Smoker    Packs/day: 0.50    Years: 30.00    Pack years: 15.00    Types: Cigarettes  . Smokeless tobacco: Never Used  Substance and Sexual Activity  . Alcohol use: No    Comment: quit  . Drug use: Yes    Comment: drugs in past.  Cocaine. 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 2 tatoos on rt lower leg none  since age 57.  Marland Kitchen Sexual activity: Not on file  Other Topics Concern  . Not on file  Social History Narrative   Lives at home with wife,   Right-handed.   3 cups caffeine per day.   Social Determinants of Health   Financial Resource Strain:   . Difficulty of Paying Living Expenses: Not on file  Food Insecurity:   . Worried About Charity fundraiser in the Last Year: Not on file  . Ran Out of Food in the Last Year: Not on file  Transportation Needs:   . Lack of Transportation (Medical): Not on file  . Lack of Transportation (Non-Medical): Not on file  Physical Activity:   . Days of Exercise per Week: Not on file  . Minutes of Exercise per Session: Not on file  Stress:   . Feeling of Stress : Not on file  Social Connections:   . Frequency of Communication with Friends and Family: Not on file  . Frequency of Social Gatherings with Friends and Family: Not on file  . Attends Religious Services: Not on file  . Active Member of Clubs or Organizations: Not on file  . Attends Archivist Meetings: Not on file  . Marital Status: Not on file  Intimate Partner Violence:   . Fear of Current or Ex-Partner: Not on file  . Emotionally Abused: Not on file  . Physically Abused: Not on file  . Sexually Abused: Not on file     PHYSICAL EXAM   Vitals:   01/10/20 1521  BP: (!) 148/87  Pulse: 72  Temp: (!) 97.5 F (36.4 C)  Weight: 186 lb 8 oz (84.6 kg)  Height: _0  (1.676 m)    Not recorded      Body mass index is 30.1 kg/m.  PHYSICAL EXAMNIATION:  Gen: NAD, conversant, well nourised, well groomed                     Cardiovascular: Regular rate rhythm, no peripheral edema, warm, nontender. Eyes: Conjunctivae clear without exudates or hemorrhage Neck: Supple, no carotid bruits. Pulmonary: Clear to auscultation bilaterally   NEUROLOGICAL EXAM:  MMSE - Mini Mental State Exam 01/10/2020 12/31/2018  Orientation to time 5 4  Orientation to Place 5 5  Registration 3  3  Attention/ Calculation 5 5  Recall 3  0  Language- name 2 objects 2 2  Language- repeat 1 1  Language- follow 3 step command 3 3  Language- read & follow direction 1 1  Write a sentence 1 1  Copy design 1 1  Total score 30 26  animal naming 13.   CRANIAL NERVES: CN II: Visual fields are full to confrontation.  Pupils are round equal and briskly reactive to light. CN III, IV, VI: extraocular movement are normal. No ptosis. CN V: Facial sensation is intact to pinprick in all 3 divisions bilaterally. Corneal responses are intact.  CN VII: Face is symmetric with normal eye closure and smile. CN VIII: Hearing is normal to causal conversation. CN IX, X: Palate elevates symmetrically. Phonation is normal. CN XI: Head turning and shoulder shrug are intact CN XII: Tongue is midline with normal movements and no atrophy.  MOTOR: No resting tremor, minimum rigidity, bradykinesia,  REFLEXES: Reflexes are 2+ and symmetric at the biceps, triceps, knees, and ankles. Plantar responses are flexor.  SENSORY: Intact to light touch, pinprick, positional sensation and vibratory sensation are intact in fingers and toes.  COORDINATION: Rapid alternating movements and fine finger movements are intact. There is no dysmetria on finger-to-nose and heel-knee-shin.    GAIT/STANCE: He can get up from seated position arms crossed, good stride, smooth turning, there is examination is 3 hours after his second dose of Sinemet   DIAGNOSTIC DATA (LABS, IMAGING, TESTING) - I reviewed patient records, labs, notes, testing and imaging myself where available.   ASSESSMENT AND PLAN  KATSUMI WISLER is a 69 y.o. male   Parkinson's disease Restless leg syndrome  Decrease his Sinemet 25/100 mg 2 tablets 3 times a day at 6 AM, 12, 6 PM,  Change Lyrica to 150 mg in the morning/300 mg at nighttime,  Seroquel 25 mg 2 tablets every night  Also add on Requip 1 mg 2 tablets every night   Marcial Pacas, M.D.  Ph.D.  [facility] [address] [place], [place] [address] Ph: [phone] Fax: [phone]  CC: Referring Provider

## 2020-02-02 DIAGNOSIS — Z6828 Body mass index (BMI) 28.0-28.9, adult: Secondary | ICD-10-CM | POA: Diagnosis not present

## 2020-02-02 DIAGNOSIS — M7022 Olecranon bursitis, left elbow: Secondary | ICD-10-CM | POA: Diagnosis not present

## 2020-02-16 DIAGNOSIS — I10 Essential (primary) hypertension: Secondary | ICD-10-CM | POA: Diagnosis not present

## 2020-02-16 DIAGNOSIS — E7849 Other hyperlipidemia: Secondary | ICD-10-CM | POA: Diagnosis not present

## 2020-03-01 DIAGNOSIS — E538 Deficiency of other specified B group vitamins: Secondary | ICD-10-CM | POA: Diagnosis not present

## 2020-03-01 DIAGNOSIS — Z720 Tobacco use: Secondary | ICD-10-CM | POA: Diagnosis not present

## 2020-03-01 DIAGNOSIS — R799 Abnormal finding of blood chemistry, unspecified: Secondary | ICD-10-CM | POA: Diagnosis not present

## 2020-03-01 DIAGNOSIS — Z13220 Encounter for screening for lipoid disorders: Secondary | ICD-10-CM | POA: Diagnosis not present

## 2020-03-01 DIAGNOSIS — R5383 Other fatigue: Secondary | ICD-10-CM | POA: Diagnosis not present

## 2020-03-09 ENCOUNTER — Other Ambulatory Visit: Payer: Self-pay | Admitting: Neurology

## 2020-03-09 DIAGNOSIS — Z6828 Body mass index (BMI) 28.0-28.9, adult: Secondary | ICD-10-CM | POA: Diagnosis not present

## 2020-03-09 DIAGNOSIS — F419 Anxiety disorder, unspecified: Secondary | ICD-10-CM | POA: Diagnosis not present

## 2020-03-09 DIAGNOSIS — G20 Parkinson's disease: Secondary | ICD-10-CM | POA: Diagnosis not present

## 2020-03-09 DIAGNOSIS — Z720 Tobacco use: Secondary | ICD-10-CM | POA: Diagnosis not present

## 2020-03-09 DIAGNOSIS — G2581 Restless legs syndrome: Secondary | ICD-10-CM | POA: Diagnosis not present

## 2020-03-09 DIAGNOSIS — M545 Low back pain: Secondary | ICD-10-CM | POA: Diagnosis not present

## 2020-03-13 DIAGNOSIS — Z6827 Body mass index (BMI) 27.0-27.9, adult: Secondary | ICD-10-CM | POA: Diagnosis not present

## 2020-03-13 DIAGNOSIS — Z0000 Encounter for general adult medical examination without abnormal findings: Secondary | ICD-10-CM | POA: Diagnosis not present

## 2020-03-13 DIAGNOSIS — F064 Anxiety disorder due to known physiological condition: Secondary | ICD-10-CM | POA: Diagnosis not present

## 2020-03-13 DIAGNOSIS — J449 Chronic obstructive pulmonary disease, unspecified: Secondary | ICD-10-CM | POA: Diagnosis not present

## 2020-03-13 DIAGNOSIS — G20 Parkinson's disease: Secondary | ICD-10-CM | POA: Diagnosis not present

## 2020-03-14 DIAGNOSIS — M810 Age-related osteoporosis without current pathological fracture: Secondary | ICD-10-CM | POA: Diagnosis not present

## 2020-03-14 DIAGNOSIS — Z0389 Encounter for observation for other suspected diseases and conditions ruled out: Secondary | ICD-10-CM | POA: Diagnosis not present

## 2020-04-11 ENCOUNTER — Other Ambulatory Visit: Payer: Self-pay

## 2020-04-11 ENCOUNTER — Ambulatory Visit: Payer: Medicare PPO | Admitting: Neurology

## 2020-04-11 ENCOUNTER — Encounter: Payer: Self-pay | Admitting: Neurology

## 2020-04-11 VITALS — BP 104/69 | HR 74 | Ht 66.0 in | Wt 176.0 lb

## 2020-04-11 DIAGNOSIS — G20 Parkinson's disease: Secondary | ICD-10-CM

## 2020-04-11 DIAGNOSIS — G2581 Restless legs syndrome: Secondary | ICD-10-CM | POA: Insufficient documentation

## 2020-04-11 DIAGNOSIS — R413 Other amnesia: Secondary | ICD-10-CM

## 2020-04-11 MED ORDER — DONEPEZIL HCL 10 MG PO TABS: 10.0000 mg | ORAL_TABLET | Freq: Every day | ORAL | 4 refills | Status: DC

## 2020-04-11 MED ORDER — PREGABALIN 150 MG PO CAPS: ORAL_CAPSULE | ORAL | 5 refills | Status: DC

## 2020-04-11 NOTE — Patient Instructions (Signed)
Continue current medications Decrease dose of Lyrica 300 mg at bedtime  See you back in 6 months

## 2020-04-11 NOTE — Progress Notes (Signed)
PATIENT: Michael Fitzgerald DOB: 12-Jan-1951  REASON FOR VISIT: follow up HISTORY FROM: patient  HISTORY OF PRESENT ILLNESS: Today 04/11/20  HISTORY Michael Sanagustin Hankinsis a 69 year old male, accompanied by his wife, seen in refer by neurosurgeon Dr. Brien Few for evaluation of gait abnormality, initial evaluation was on January 29, 2018.  I reviewed and summarized the referring note, he had a history of depression, anxiety, chronic migraine headache.  Around summer of 2018, he noticed gradual onset bilateral lower extremity especially calf area numbness tingling achy pain, he also noticed gradual onset low back pain, lower extremity weakness, was seen by neurosurgeon Dr. Trenton Gammon, later by pain management Dr. Brien Few, received a few rounds of epidural injection failed to improve his symptoms,  He also had MRIs, MRI of the brain showed no significant abnormality, MRI of lumbar and thoracic spine showed mild degenerative changes, but no significant canal foraminal stenosis to explain his gradual worsening gait abnormality,  He no longer has significant low back pain, denying neck pain, but his gait abnormality gradually getting worse since January 2019, he describes difficulty initiate gait, difficulty turning, there is no hand tremor,  Wife also reported some personality change, mild memory loss,  Laboratory evaluation seen March 2019 showed normal C-reactive protein, CPK, ESR, TSH, folic acid, ANA, RPR, vitamin B12  Electrodiagnostic studyon March 29 2019is normal, there is no evidence of large fiber peripheral neuropathy, lumbosacral radiculopathy.  We have personally reviewed MRI of cervical spine in March 2019, mild multilevel degenerative disc disease, variable degree of foraminal narrowing, there was no evidence of nerve root compression or canal stenosis.  UPDATEApril 15, 2019:  His gait abnormality has much improved with Sinemet 25/100 mg 3 times daily, but complains of GI side  effect, he has 7 pound weight loss over the past few weeks  UPDATE Dec 31 2018: Last visit was with [facility] October 2019, he complains of GI side effect with carbidopa levodopa, also complains of low back pain receiving epidural injection  He is with his daughter Michael Fitzgerald and sister Michael Fitzgerald today's clinical visit, he had acute worsening over the past few months, increased confusion, gait abnormality, difficulty sleeping, episode of jumping pain throughout his body,  He was seen by emergency room, add on Mirapex 1 mg every night, gabapentin, with no significant change, making very sleepy, seems to be more agitated, confused, take frequent naps during the day, he also has worsening memory loss,  We personally reviewed MRI films,MRI of the brain January 2019, no acute abnormality,, MRI cervical spine March 2019, mild degenerative changes no significant canal or foraminal narrowing.  MRI of thoracic, lumbar spine in 2018, mild degenerative changes no evidence of nerve root compression.  Virtual Visit via Video on Apr 01 2019 He is taking carbidopa levodopa 25/100 mg 2 tablets 3 times a day, which has controlled his Parkinson's symptoms much better, he is tolerating Aricept 10 mg daily, Seroquel 25 mg every night," I have not feel this good for a long time"  UPDATE Sep 14 2019: He is accompanied by his sister at today's clinic visit, he used to take oxycodone 5 mg 3 times a day for chronic low back pain, he stopped it abruptly around September 2020, he went through withdrawal, hallucinations, diarrhea, also develop uncontrollable leg shaking, which is still present to now, over the past few days, he has been take large dose of Sinemet up to 18 tablets a day, which provide temporary relief, pacing around, taking a hot shower also.  provide temporary relief, he described uncontrollable bilateral lower extremity jumpy movement, feel like electricity shock, very painful,  UPDATE Jan 10 2020: He  is overall doing much better  He is currently taking Sinemet 25/100 mg 2 tablets 4 times a day at 6, 12, 6, and 12 AM, Lyrica 150 mg 3 times a day, also Seroquel 25 mg 2 tablets every night, he continue complains of difficulty sleeping, frequent leg jerking movement, bilateral lower extremity muscle tightness, sometimes wake up from sleep due to leg pain, discomfort,  Personally reviewed data scan in February 2021, asymmetric bilateral reduction in radiotracer activity within the posterior striata, right greater than left, in a pattern typical of Parkinson's syndrome pathology  Update Apr 11, 2020 SS: When last seen, Sinemet was decreased 25/100 mg, 2 tablets 3 times daily (vs 4), Lyrica changed to 150 mg am/300 mg pm, added on Requip 1 mg 2 tablets at bedtime.  Indicates there is no way he could decrease his dose of Sinemet to just 3 times a day, has continued taking 2 tablets 4 times daily at 6 a, 12 p, 6 p, 12 a.  Has decreased Lyrica to just 300 mg at bedtime, doing well with Requip.  Indicates medications are doing well, he is feeling well.  His back pain is fairly well controlled.  No longer taking Seroquel.  He has had some episodes of low blood pressure, his PCP has him on midodrine 5 mg twice a day. He had 1 episode of black out few weeks ago, stood up too fast from sleep.  He is feeling more like himself, he took the boat out and went fishing, previously wouldn't have felt confident enough.  Presents today for evaluation accompanied by his wife. Memory stable, on aricept.   REVIEW OF SYSTEMS: Out of a complete 14 system review of symptoms, the patient complains only of the following symptoms, and all other reviewed systems are negative.  N/A  ALLERGIES: Allergies  Allergen Reactions  . Latex Other (See Comments)    Pt. States " makes my skin burn"  . Gabapentin Other (See Comments)    Violent, memory loss, loss of appetite.   . Prednisone     hallucination  . Sildenafil Citrate      Chest pain    HOME MEDICATIONS: Outpatient Medications Prior to Visit  Medication Sig Dispense Refill  . albuterol (PROVENTIL HFA;VENTOLIN HFA) 108 (90 BASE) MCG/ACT inhaler Inhale into the lungs every 6 (six) hours as needed for wheezing or shortness of breath.    . ALPRAZolam (XANAX) 1 MG tablet Take 1 mg by mouth 3 (three) times daily.    . carbidopa-levodopa (SINEMET IR) 25-100 MG tablet Take 2 tablets by mouth 4 (four) times daily. (Patient taking differently: Take 2 tablets by mouth 3 (three) times daily. ) 700 tablet 4  . midodrine (PROAMATINE) 5 MG tablet Take 5 mg by mouth 2 (two) times daily.    Marland Kitchen rOPINIRole (REQUIP) 1 MG tablet Take 2 tablets (2 mg total) by mouth at bedtime. 60 tablet 11  . donepezil (ARICEPT) 10 MG tablet Take 1 tablet (10 mg total) by mouth at bedtime. 90 tablet 4  . pregabalin (LYRICA) 150 MG capsule One in the morning, 2 at night 90 capsule 5  . QUEtiapine (SEROQUEL) 25 MG tablet Take 2 tablets (50 mg total) by mouth at bedtime. 180 tablet 4   No facility-administered medications prior to visit.    PAST MEDICAL HISTORY: Past Medical History:  Diagnosis Date  .  Anxiety 03/25/2011  . Arthritis   . 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 Complication of anesthesia    "wild when wakes up"  . COPD (chronic obstructive pulmonary disease) ([place])    "slight"  . Depression   . Erectile dysfunction 03/25/2011  . GERD (gastroesophageal reflux disease)    rolaids occ  . Hepatitis C 03/25/2011  . 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 Night terrors   . Parkinsonism ([place])   . RLS (restless legs syndrome)   . Sleep apnea    no cpap  . Sleep walking     PAST SURGICAL HISTORY: Past Surgical History:  Procedure Laterality Date  . COLONOSCOPY  03/25/2011,16  . 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  Marland Kitchen LIVER BIOPSY     Dr. Arlana Pouch at [facility] over 10  yrs ago.  Marland Kitchen PAROTIDECTOMY Left 05/25/2015  . PAROTIDECTOMY Left 05/25/2015   Procedure: LEFT PAROTIDECTOMY;  Surgeon: Izora Gala, MD;  Location: [facility];  Service: ENT;  Laterality: Left;  . TONSILLECTOMY      FAMILY HISTORY: Family History  Problem Relation Age of Onset  . Colon cancer Mother   . Other Father        unsure of history    SOCIAL HISTORY: Social History   Socioeconomic History  . Marital status: Married    Spouse name: Not on file  . Number of children: 2  . Years of education: 62  . Highest education level: High school graduate  Occupational History  . Occupation: Retired  Tobacco Use  . Smoking status: Current Every Day Smoker    Packs/day: 0.50    Years: 30.00    Pack years: 15.00    Types: Cigarettes  . Smokeless tobacco: Never Used  Substance and Sexual Activity  . Alcohol use: No    Comment: quit  . Drug use: Yes    Comment: drugs in past.  Cocaine. 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 2 tatoos on rt lower leg none since age 85.  Marland Kitchen Sexual activity: Not on file  Other Topics Concern  . Not on file  Social History Narrative   Lives at home with wife,   Right-handed.   3 cups caffeine per day.   Social Determinants of Health   Financial Resource Strain:   . Difficulty of Paying Living Expenses:   Food Insecurity:   . Worried About Charity fundraiser in the Last Year:   . Arboriculturist in the Last Year:   Transportation Needs:   . Film/video editor (Medical):   Marland Kitchen Lack of Transportation (Non-Medical):   Physical Activity:   . Days of Exercise per Week:   . Minutes of Exercise per Session:   Stress:   . Feeling of Stress :   Social Connections:   . Frequency of Communication with Friends and Family:   . Frequency of Social Gatherings with Friends and Family:   . Attends Religious Services:   . Active Member of Clubs or Organizations:   . Attends Archivist Meetings:   Marland Kitchen Marital Status:   Intimate Partner Violence:   . Fear of Current  or Ex-Partner:   . Emotionally Abused:   Marland Kitchen Physically Abused:   . Sexually Abused:    PHYSICAL EXAM  Vitals:   04/11/20 1359  BP: 104/69  Pulse: 74  Weight: 176 lb (79.8 kg)  Height: '5\' 6"'$  (1.676 m)   Body mass index is 28.41 kg/m.  Generalized: Well developed, in no acute distress  MMSE - Mini Mental State Exam 01/10/2020 12/31/2018  Orientation to time 5 4  Orientation to Place 5 5  Registration 3 3  Attention/ Calculation 5 5  Recall 3 0  Language- name 2 objects 2 2  Language- repeat 1 1  Language- follow 3 step command 3 3  Language- read & follow direction 1 1  Write a sentence 1 1  Copy design 1 1  Total score 30 26    Neurological examination  Mentation: Alert oriented to time, place, history taking. Follows all commands speech and language fluent, mild masking of the face Cranial nerve II-XII: Pupils were equal round reactive to light. Extraocular movements were full, visual field were full on confrontational test. Facial sensation and strength were normal.  Head turning and shoulder shrug  were normal and symmetric. Motor: The motor testing reveals 5 over 5 strength of all 4 extremities. Good symmetric motor tone is noted throughout. No tremor noted, no bradykinesia or rigidity on exams, movements are swift  Sensory: Sensory testing is intact to soft touch on all 4 extremities. No evidence of extinction is noted.  Coordination: Cerebellar testing reveals good finger-nose-finger and heel-to-shin bilaterally.  Gait and station: Able to rise from seated position without pushoff, gait is swift, good stride, smooth turns Reflexes: Deep tendon reflexes are symmetric and normal bilaterally.   DIAGNOSTIC DATA (LABS, IMAGING, TESTING) - I reviewed patient records, labs, notes, testing and imaging myself where available.  Lab Results  Component Value Date   WBC 7.6 09/14/2019   HGB 13.2 09/14/2019   HCT 39.8 09/14/2019   MCV 98 (H) 09/14/2019   PLT 193 05/17/2015       Component Value Date/Time   NA 140 09/14/2019 1521   K 4.4 09/14/2019 1521   CL 105 09/14/2019 1521   CO2 22 09/14/2019 1521   GLUCOSE 124 (H) 09/14/2019 1521   GLUCOSE 96 05/17/2015 1512   BUN 13 09/14/2019 1521   CREATININE 0.93 09/14/2019 1521   CALCIUM 9.1 09/14/2019 1521   PROT 6.5 09/14/2019 1521   ALBUMIN 4.1 09/14/2019 1521   AST 12 09/14/2019 1521   ALT 4 09/14/2019 1521   ALKPHOS 101 09/14/2019 1521   BILITOT 0.3 09/14/2019 1521   GFRNONAA 84 09/14/2019 1521   GFRAA 97 09/14/2019 1521   No results found for: CHOL, HDL, LDLCALC, LDLDIRECT, TRIG, CHOLHDL No results found for: HGBA1C Lab Results  Component Value Date   VITAMINB12 203 (L) 09/14/2019   Lab Results  Component Value Date   TSH 0.378 (L) 09/14/2019      ASSESSMENT AND PLAN 69 y.o. year old male  has a past medical history of Anxiety (03/25/2011), Arthritis, Blood in the urine, Complication of anesthesia, COPD (chronic obstructive pulmonary disease) ([place]), Depression, Erectile dysfunction (03/25/2011), GERD (gastroesophageal reflux disease), Hepatitis C (03/25/2011), Manic depressive disease manic phase ([place]), Night terrors, Parkinsonism ([place]), RLS (restless legs syndrome), Sleep apnea, and Sleep walking. here with:  1.  Parkinson's disease 2.  Restless leg syndrome 3.  Memory loss (recent MMSE 30/30) -Overall, stable, doing well with current medications, could not decrease Sinemet to 3 times a day "I wouldn't be able to move in the bed" -Continue Sinemet 25 100 mg, 2 tablets 4 times a day, 6 am, 12p, 6 p, 12 a -  Decrease Lyrica 300 mg at bedtime (new prescription sent) -Seroquel has been discontinued -Continue Requip 1 mg, 2 tablets at bedtime -Continue Aricept 10 mg daily -Follow-up in 6 months or sooner if needed  I spent 30 minutes of face-to-face and non-face-to-face time with patient.  This included previsit chart review, lab review, study review, order entry, electronic health record  documentation, patient education.  Butler Denmark, AGNP-C, DNP 04/11/2020, 2:34 PM [facility] [address], [place], [place] [address] [phone]

## 2020-04-29 DIAGNOSIS — H5213 Myopia, bilateral: Secondary | ICD-10-CM | POA: Diagnosis not present

## 2020-04-29 DIAGNOSIS — H40033 Anatomical narrow angle, bilateral: Secondary | ICD-10-CM | POA: Diagnosis not present

## 2020-06-12 DIAGNOSIS — J449 Chronic obstructive pulmonary disease, unspecified: Secondary | ICD-10-CM | POA: Diagnosis not present

## 2020-06-12 DIAGNOSIS — G20 Parkinson's disease: Secondary | ICD-10-CM | POA: Diagnosis not present

## 2020-06-12 DIAGNOSIS — F064 Anxiety disorder due to known physiological condition: Secondary | ICD-10-CM | POA: Diagnosis not present

## 2020-06-12 DIAGNOSIS — Z6826 Body mass index (BMI) 26.0-26.9, adult: Secondary | ICD-10-CM | POA: Diagnosis not present

## 2020-06-16 DIAGNOSIS — G20 Parkinson's disease: Secondary | ICD-10-CM | POA: Diagnosis not present

## 2020-06-16 DIAGNOSIS — F0280 Dementia in other diseases classified elsewhere without behavioral disturbance: Secondary | ICD-10-CM | POA: Diagnosis not present

## 2020-06-16 DIAGNOSIS — G2581 Restless legs syndrome: Secondary | ICD-10-CM | POA: Diagnosis not present

## 2020-06-16 DIAGNOSIS — E538 Deficiency of other specified B group vitamins: Secondary | ICD-10-CM | POA: Diagnosis not present

## 2020-07-11 ENCOUNTER — Other Ambulatory Visit: Payer: Self-pay | Admitting: Neurology

## 2020-07-16 ENCOUNTER — Other Ambulatory Visit: Payer: Self-pay | Admitting: Neurology

## 2020-09-13 DIAGNOSIS — Z6827 Body mass index (BMI) 27.0-27.9, adult: Secondary | ICD-10-CM | POA: Diagnosis not present

## 2020-09-13 DIAGNOSIS — F064 Anxiety disorder due to known physiological condition: Secondary | ICD-10-CM | POA: Diagnosis not present

## 2020-09-13 DIAGNOSIS — J449 Chronic obstructive pulmonary disease, unspecified: Secondary | ICD-10-CM | POA: Diagnosis not present

## 2020-09-13 DIAGNOSIS — G20 Parkinson's disease: Secondary | ICD-10-CM | POA: Diagnosis not present

## 2020-10-09 NOTE — Progress Notes (Signed)
I have reviewed and agreed above plan. 

## 2020-10-16 ENCOUNTER — Telehealth: Payer: Self-pay

## 2020-10-16 ENCOUNTER — Encounter: Payer: Self-pay | Admitting: Neurology

## 2020-10-16 ENCOUNTER — Other Ambulatory Visit: Payer: Self-pay

## 2020-10-16 ENCOUNTER — Ambulatory Visit: Payer: Medicare PPO | Admitting: Neurology

## 2020-10-16 VITALS — BP 110/70 | HR 72 | Ht 66.0 in | Wt 179.0 lb

## 2020-10-16 DIAGNOSIS — G20 Parkinson's disease: Secondary | ICD-10-CM

## 2020-10-16 DIAGNOSIS — G2581 Restless legs syndrome: Secondary | ICD-10-CM

## 2020-10-16 MED ORDER — CARBIDOPA-LEVODOPA 25-100 MG PO TABS: 2.0000 | ORAL_TABLET | Freq: Four times a day (QID) | ORAL | 4 refills | Status: DC

## 2020-10-16 NOTE — Patient Instructions (Signed)
Continue current medications Will check on Lyrica refill See you back in 6 months

## 2020-10-16 NOTE — Telephone Encounter (Signed)
-----   Message from Suzzanne Cloud, NP sent at 10/16/2020  3:02 PM EST ----- Can you check with [facility], make sure he has # 1 refill left? Then going forward future Lyrica from [place].

## 2020-10-16 NOTE — Progress Notes (Signed)
PATIENT: Michael Fitzgerald DOB: 26-May-1951  REASON FOR VISIT: follow up HISTORY FROM: patient  HISTORY OF PRESENT ILLNESS: Today 10/16/20  HISTORY  Michael Korte Hankinsis a 69 year old male, accompanied by his wife, seen in refer by neurosurgeon Dr. Brien Few for evaluation of gait abnormality, initial evaluation was on January 29, 2018.  I reviewed and summarized the referring note, he had a history of depression, anxiety, chronic migraine headache.  Around summer of 2018, he noticed gradual onset bilateral lower extremity especially calf area numbness tingling achy pain, he also noticed gradual onset low back pain, lower extremity weakness, was seen by neurosurgeon Dr. Trenton Gammon, later by pain management Dr. Brien Few, received a few rounds of epidural injection failed to improve his symptoms,  He also had MRIs, MRI of the brain showed no significant abnormality, MRI of lumbar and thoracic spine showed mild degenerative changes, but no significant canal foraminal stenosis to explain his gradual worsening gait abnormality,  He no longer has significant low back pain, denying neck pain, but his gait abnormality gradually getting worse since January 2019, he describes difficulty initiate gait, difficulty turning, there is no hand tremor,  Wife also reported some personality change, mild memory loss,  Laboratory evaluation seen March 2019 showed normal C-reactive protein, CPK, ESR, TSH, folic acid, ANA, RPR, vitamin B12  Electrodiagnostic studyon March 29 2019is normal, there is no evidence of large fiber peripheral neuropathy, lumbosacral radiculopathy.  We have personally reviewed MRI of cervical spine in March 2019, mild multilevel degenerative disc disease, variable degree of foraminal narrowing, there was no evidence of nerve root compression or canal stenosis.  UPDATEApril 15, 2019:  His gait abnormality has much improved with Sinemet 25/100 mg 3 times daily, but complains of GI  side effect, he has 7 pound weight loss over the past few weeks  UPDATE Dec 31 2018: Last visit was with [facility] October 2019, he complains of GI side effect with carbidopa levodopa, also complains of low back pain receiving epidural injection  He is with his daughter Michael Fitzgerald and sister Michael Fitzgerald today's clinical visit, he had acute worsening over the past few months, increased confusion, gait abnormality, difficulty sleeping, episode of jumping pain throughout his body,  He was seen by emergency room, add on Mirapex 1 mg every night, gabapentin, with no significant change, making very sleepy, seems to be more agitated, confused, take frequent naps during the day, he also has worsening memory loss,  We personally reviewed MRI films,MRI of the brain January 2019, no acute abnormality,, MRI cervical spine March 2019, mild degenerative changes no significant canal or foraminal narrowing.  MRI of thoracic, lumbar spine in 2018, mild degenerative changes no evidence of nerve root compression.  Virtual Visit via Video on Apr 01 2019 He is taking carbidopa levodopa 25/100 mg 2 tablets 3 times a day, which has controlled his Parkinson's symptoms much better, he is tolerating Aricept 10 mg daily, Seroquel 25 mg every night," I have not feel this good for a long time"  UPDATE Sep 14 2019: He is accompanied by his sister at today's clinic visit, he used to take oxycodone 5 mg 3 times a day for chronic low back pain, he stopped it abruptly around September 2020, he went through withdrawal, hallucinations, diarrhea, also develop uncontrollable leg shaking, which is still present to now, over the past few days, he has been take large dose of Sinemet up to 18 tablets a day, which provide temporary relief, pacing around, taking a hot  shower also. provide temporary relief, he described uncontrollable bilateral lower extremity jumpy movement, feel like electricity shock, very painful,  UPDATE Jan 10 2020: He is overall doing much better  He is currently taking Sinemet 25/100 mg 2 tablets 4 times a day at 6, 12, 6, and 12 AM, Lyrica 150 mg 3 times a day, also Seroquel 25 mg 2 tablets every night, he continue complains of difficulty sleeping, frequent leg jerking movement, bilateral lower extremity muscle tightness, sometimes wake up from sleep due to leg pain, discomfort,  Personally reviewed data scan in February 2021, asymmetric bilateral reduction in radiotracer activity within the posterior striata, right greater than left, in a pattern typical of Parkinson's syndrome pathology  Update Apr 11, 2020 SS: When last seen, Sinemet was decreased 25/100 mg, 2 tablets 3 times daily (vs 4), Lyrica changed to 150 mg am/300 mg pm, added on Requip 1 mg 2 tablets at bedtime.  Indicates there is no way he could decrease his dose of Sinemet to just 3 times a day, has continued taking 2 tablets 4 times daily at 6 a, 12 p, 6 p, 12 a.  Has decreased Lyrica to just 300 mg at bedtime, doing well with Requip.  Indicates medications are doing well, he is feeling well.  His back pain is fairly well controlled.  No longer taking Seroquel.  He has had some episodes of low blood pressure, his PCP has him on midodrine 5 mg twice a day. He had 1 episode of black out few weeks ago, stood up too fast from sleep.  He is feeling more like himself, he took the boat out and went fishing, previously wouldn't have felt confident enough.  Presents today for evaluation accompanied by his wife. Memory stable, on aricept.   Update 10/16/2020 SS: Here today with his sister, has been doing well.  Taking Lyrica 300 mg at bedtime, Sinemet 25-100 mg 2 tablet 4 times daily, on Requip 3 mg (2 tablets at bedtime from PCP), midodrine 5 mg once daily (PCP), Aricept 10 mg daily.  No falls, may have some bad days, where legs feel heavy, worries medications don't work, no freezing. Would like to transfer Lyrica to local pharmacy in the future,  hard time getting through mail order. Anxiety on Xanax. Is currently pleased.   REVIEW OF SYSTEMS: Out of a complete 14 system review of symptoms, the patient complains only of the following symptoms, and all other reviewed systems are negative.  N/A  ALLERGIES: Allergies  Allergen Reactions   Latex Other (See Comments)    Pt. States " makes my skin burn"   Gabapentin Other (See Comments)    Violent, memory loss, loss of appetite.    Prednisone     hallucination   Sildenafil Citrate     Chest pain    HOME MEDICATIONS: Outpatient Medications Prior to Visit  Medication Sig Dispense Refill   albuterol (PROVENTIL HFA;VENTOLIN HFA) 108 (90 BASE) MCG/ACT inhaler Inhale into the lungs every 6 (six) hours as needed for wheezing or shortness of breath.     ALPRAZolam (XANAX) 1 MG tablet Take 1 mg by mouth 2 (two) times daily.      donepezil (ARICEPT) 10 MG tablet Take 1 tablet (10 mg total) by mouth at bedtime. 90 tablet 4   midodrine (PROAMATINE) 5 MG tablet Take 5 mg by mouth 2 (two) times daily.     pregabalin (LYRICA) 150 MG capsule Take 2 at night 60 capsule 5   rOPINIRole (  REQUIP) 1 MG tablet Take 2 tablets (2 mg total) by mouth at bedtime. (Patient taking differently: Take 3 mg by mouth at bedtime. ) 60 tablet 11   carbidopa-levodopa (SINEMET IR) 25-100 MG tablet Take 2 tablets by mouth 4 (four) times daily. (Patient taking differently: Take 2 tablets by mouth 3 (three) times daily. ) 700 tablet 4   No facility-administered medications prior to visit.    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   LIVER BIOPSY     Dr. Arlana Pouch at [facility] over 10 yrs ago.   PAROTIDECTOMY Left 05/25/2015   PAROTIDECTOMY Left 05/25/2015   Procedure: LEFT PAROTIDECTOMY;  Surgeon: Izora Gala, MD;  Location: [facility] OR;  Service: ENT;  Laterality: Left;   TONSILLECTOMY      FAMILY HISTORY: Family History  Problem Relation Age of Onset   Colon cancer Mother    Other Father        unsure of history    SOCIAL HISTORY: Social History   Socioeconomic History   Marital status: Married    Spouse name: Not on file   Number of children: 2   Years of education: 12   Highest education level: High school graduate  Occupational History   Occupation: Retired  Tobacco Use   Smoking status: Current Every Day Smoker    Packs/day: 0.50    Years: 30.00    Pack years: 15.00    Types: Cigarettes   Smokeless tobacco: Never Used  Substance and Sexual Activity   Alcohol use: No    Comment: quit   Drug use: Yes    Comment: drugs in past.  Cocaine. 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 2 tatoos on rt lower leg none since age 89.   Sexual activity: Not on file  Other Topics Concern   Not on file  Social History Narrative   Lives at home with wife,   Right-handed.   3 cups caffeine per day.   Social Determinants of Health   Financial Resource Strain:    Difficulty of Paying Living Expenses: Not on file  Food Insecurity:    Worried About Charity fundraiser in the Last Year: Not on file   [employer] of Food in the Last Year: Not on file  Transportation Needs:    Lack of Transportation (Medical): Not on file    Lack of Transportation (Non-Medical): Not on file  Physical Activity:    Days of Exercise per Week: Not on file   Minutes of Exercise per Session: Not on file  Stress:  Feeling of Stress : Not on file  Social Connections:    Frequency of Communication with Friends and Family: Not on file   Frequency of Social Gatherings with Friends and Family: Not on file   Attends Religious Services: Not on file   Active Member of Clubs or Organizations: Not on file   Attends Archivist Meetings: Not on file   Marital Status: Not on file  Intimate Partner Violence:    Fear of Current or Ex-Partner: Not on file   Emotionally Abused: Not on file   Physically Abused: Not on file   Sexually Abused: Not on file   PHYSICAL EXAM  Vitals:   10/16/20 1419  BP: 110/70  Pulse: 72  Weight: 179 lb (81.2 kg)  Height: 5' 6" (1.676 m)   Body mass index is 28.89 kg/m.  Generalized: Well developed, in no acute distress   Neurological examination  Mentation: Alert oriented to time, place, history taking. Follows all commands speech and language fluent Cranial nerve II-XII: Pupils were equal round reactive to light. Extraocular movements were full, visual field were full on confrontational test. Facial sensation and strength were normal. Head turning and shoulder shrug  were normal and symmetric. Motor: The motor testing reveals 5 over 5 strength of all 4 extremities. Good symmetric motor tone is noted throughout.  No tremor noted, no bradykinesia or rigidity on exam, movements are swift. Sensory: Sensory testing is intact to soft touch on all 4 extremities. No evidence of extinction is noted.  Coordination: Cerebellar testing reveals good finger-nose-finger and heel-to-shin bilaterally.  Gait and station: Stand from seated position without pushoff, gait is quick, good arm swing, smooth turns Reflexes: Deep tendon reflexes are symmetric and normal bilaterally.   DIAGNOSTIC DATA (LABS,  IMAGING, TESTING) - I reviewed patient records, labs, notes, testing and imaging myself where available.  Lab Results  Component Value Date   WBC 7.6 09/14/2019   HGB 13.2 09/14/2019   HCT 39.8 09/14/2019   MCV 98 (H) 09/14/2019   PLT 193 05/17/2015      Component Value Date/Time   NA 140 09/14/2019 1521   K 4.4 09/14/2019 1521   CL 105 09/14/2019 1521   CO2 22 09/14/2019 1521   GLUCOSE 124 (H) 09/14/2019 1521   GLUCOSE 96 05/17/2015 1512   BUN 13 09/14/2019 1521   CREATININE 0.93 09/14/2019 1521   CALCIUM 9.1 09/14/2019 1521   PROT 6.5 09/14/2019 1521   ALBUMIN 4.1 09/14/2019 1521   AST 12 09/14/2019 1521   ALT 4 09/14/2019 1521   ALKPHOS 101 09/14/2019 1521   BILITOT 0.3 09/14/2019 1521   GFRNONAA 84 09/14/2019 1521   GFRAA 97 09/14/2019 1521   No results found for: CHOL, HDL, LDLCALC, LDLDIRECT, TRIG, CHOLHDL No results found for: HGBA1C Lab Results  Component Value Date   VITAMINB12 203 (L) 09/14/2019   Lab Results  Component Value Date   TSH 0.378 (L) 09/14/2019    ASSESSMENT AND PLAN 69 y.o. 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 here with:  1.  Parkinson's disease 2.  Restless leg syndrome -Overall stable with current medications -Continue Sinemet 25-100 mg, 2 tablets 4 times a day ("no way" he could  drop down to 3 times daily dosing" -Continue Lyrica 150 mg, 2 tablets at bedtime (should have 1 refill left from [facility], wants future refills sent to local pharmacy, [facility]) -On Requip 3 mg, 2 tablets at bedtime from PCP -Off Seroquel at night, takes Xanax 2 times daily from  PCP -Follow-up in 6 months or sooner if needed  3.  Memory loss -Last MMSE 30/30, check next visit -Continue Aricept 10 mg at bedtime  I spent 30 minutes of face-to-face and non-face-to-face time with patient.  This included previsit chart review, lab review, study review, order entry, electronic health record documentation, patient education.  Butler Denmark, AGNP-C, DNP 10/16/2020, 2:56 PM [facility] [address], [place], [place] [address] [phone]

## 2020-10-16 NOTE — Telephone Encounter (Signed)
[facility]. Pharmacist confirmed that the patient does have 1 refill remaining for Lyrica 150mg  for 60 tablets. Prescription is not set to expire until May 2022 so the patient should be able to finish out the 1 refill left.

## 2020-10-18 ENCOUNTER — Telehealth: Payer: Self-pay | Admitting: Neurology

## 2020-10-18 MED ORDER — PREGABALIN 150 MG PO CAPS: ORAL_CAPSULE | ORAL | 3 refills | Status: DC

## 2020-10-18 MED ORDER — PREGABALIN 150 MG PO CAPS: ORAL_CAPSULE | ORAL | 0 refills | Status: DC

## 2020-10-18 NOTE — Addendum Note (Signed)
Addended by: Suzzanne Cloud on: 10/18/2020 02:14 PM   Modules accepted: Orders

## 2020-10-18 NOTE — Telephone Encounter (Signed)
Pt called, [place] notified me pregabalin (LYRICA) 150 MG capsule is ready, but [facility] need prescription sent from the physician.

## 2020-10-18 NOTE — Telephone Encounter (Signed)
I sent refill to [facility] 60 tablets 30 days, no refills. Sent refill to [facility] 60 tablets 3 refills. Last fill was 09/13/2020 # 60 tablets, 4/5 refills but written in May so expired last refill.

## 2020-10-18 NOTE — Telephone Encounter (Signed)
Called patient to clarify pharmacy as last note stated future Lyrica refills to local pharmacy. Patient stated he is almost out of medicine, wants 1 month Refill sent to [place] then future refills to [facility]. He stated [employer] has reduced their cost to him down to $15 x 3 month supply. I advised will let Judson Roch know his requests. Patient verbalized understanding, appreciation.

## 2020-12-06 ENCOUNTER — Other Ambulatory Visit: Payer: Self-pay | Admitting: Neurology

## 2020-12-06 ENCOUNTER — Other Ambulatory Visit: Payer: Self-pay

## 2020-12-14 DIAGNOSIS — I959 Hypotension, unspecified: Secondary | ICD-10-CM | POA: Diagnosis not present

## 2020-12-14 DIAGNOSIS — Z0000 Encounter for general adult medical examination without abnormal findings: Secondary | ICD-10-CM | POA: Diagnosis not present

## 2020-12-14 DIAGNOSIS — G20 Parkinson's disease: Secondary | ICD-10-CM | POA: Diagnosis not present

## 2020-12-14 DIAGNOSIS — Z6826 Body mass index (BMI) 26.0-26.9, adult: Secondary | ICD-10-CM | POA: Diagnosis not present

## 2020-12-14 DIAGNOSIS — J449 Chronic obstructive pulmonary disease, unspecified: Secondary | ICD-10-CM | POA: Diagnosis not present

## 2020-12-14 DIAGNOSIS — F064 Anxiety disorder due to known physiological condition: Secondary | ICD-10-CM | POA: Diagnosis not present

## 2020-12-21 NOTE — Progress Notes (Signed)
I have reviewed and agreed above plan. 

## 2021-01-01 ENCOUNTER — Encounter (INDEPENDENT_AMBULATORY_CARE_PROVIDER_SITE_OTHER): Payer: Self-pay | Admitting: *Deleted

## 2021-01-23 DIAGNOSIS — N400 Enlarged prostate without lower urinary tract symptoms: Secondary | ICD-10-CM | POA: Diagnosis not present

## 2021-01-23 DIAGNOSIS — Z6825 Body mass index (BMI) 25.0-25.9, adult: Secondary | ICD-10-CM | POA: Diagnosis not present

## 2021-03-05 ENCOUNTER — Telehealth: Payer: Self-pay

## 2021-03-05 ENCOUNTER — Other Ambulatory Visit: Payer: Self-pay

## 2021-03-05 MED ORDER — PREGABALIN 150 MG PO CAPS: ORAL_CAPSULE | ORAL | 3 refills | Status: DC

## 2021-03-05 NOTE — Telephone Encounter (Signed)
[place] drug registry was checked, last filled 01/22/21. Pt last seen 10/16/20 advised to FU in 6 months, has upcoming appt 04/17/21. Refill is appropriate on my end, please advise. Pharmacy has been changed to [facility] and Rx is pending waiting for your approval.

## 2021-03-05 NOTE — Addendum Note (Signed)
Addended by: Suzzanne Cloud on: 03/05/2021 12:20 PM   Modules accepted: Orders

## 2021-03-05 NOTE — Telephone Encounter (Signed)
Pt called requesting a refill on pregabalin (LYRICA) 150 MG capsule to be sent to [place].  Pt sts med is cheaper through mail service than local pharmacy.   Pt asked that he be called once med is sent and if any issues come up.

## 2021-03-08 ENCOUNTER — Encounter: Payer: Self-pay | Admitting: Urology

## 2021-03-08 ENCOUNTER — Ambulatory Visit (INDEPENDENT_AMBULATORY_CARE_PROVIDER_SITE_OTHER): Payer: Medicare PPO | Admitting: Urology

## 2021-03-08 ENCOUNTER — Other Ambulatory Visit: Payer: Self-pay

## 2021-03-08 VITALS — BP 93/57 | HR 79 | Temp 97.8°F | Ht 67.0 in | Wt 165.0 lb

## 2021-03-08 DIAGNOSIS — R351 Nocturia: Secondary | ICD-10-CM | POA: Diagnosis not present

## 2021-03-08 DIAGNOSIS — N401 Enlarged prostate with lower urinary tract symptoms: Secondary | ICD-10-CM | POA: Diagnosis not present

## 2021-03-08 DIAGNOSIS — R3912 Poor urinary stream: Secondary | ICD-10-CM | POA: Diagnosis not present

## 2021-03-08 DIAGNOSIS — R3129 Other microscopic hematuria: Secondary | ICD-10-CM

## 2021-03-08 DIAGNOSIS — R3915 Urgency of urination: Secondary | ICD-10-CM

## 2021-03-08 LAB — MICROSCOPIC EXAMINATION
Bacteria, UA: NONE SEEN
Renal Epithel, UA: NONE SEEN /hpf

## 2021-03-08 LAB — URINALYSIS, ROUTINE W REFLEX MICROSCOPIC
Bilirubin, UA: NEGATIVE
Glucose, UA: NEGATIVE
Nitrite, UA: NEGATIVE
Protein,UA: NEGATIVE
Specific Gravity, UA: 1.025 (ref 1.005–1.030)
Urobilinogen, Ur: 1 mg/dL (ref 0.2–1.0)
pH, UA: 6.5 (ref 5.0–7.5)

## 2021-03-08 LAB — BLADDER SCAN AMB NON-IMAGING: Scan Result: 33

## 2021-03-08 NOTE — Progress Notes (Signed)
post void residual=33  Urological Symptom Review  Patient is experiencing the following symptoms: Frequent urination Hard to postpone urination Burning/pain with urination Get up at night to urinate Leakage of urine Stream starts and stops Trouble starting stream Have to strain to urinate Weak stream   Review of Systems  Gastrointestinal (upper)  : Nausea  Gastrointestinal (lower) : Constipation  Constitutional : Weight loss Fatigue  Skin: Negative for skin symptoms  Eyes: Blurred vision  Ear/Nose/Throat : Negative for Ear/Nose/Throat symptoms  Hematologic/Lymphatic: Negative for Hematologic/Lymphatic symptoms  Cardiovascular : Negative for cardiovascular symptoms  Respiratory : Negative for respiratory symptoms  Endocrine: Negative for endocrine symptoms  Musculoskeletal: Back pain Joint pain  Neurological: Headaches Dizziness  Psychologic: Depression Anxiety

## 2021-03-08 NOTE — Progress Notes (Signed)
Subjective: 1. Michael localized prostatic hyperplasia with lower urinary tract symptoms (LUTS)   2. Weak urinary stream   3. Urgency of urination   4. Nocturia   5. Microhematuria      Consult requested by Michael Bang MD  [name] is a 70 yo male with Parkinson's disease who is sent for evaluation of lower urinary tract symptoms.  He has been on finasteride and alfuzosin about 2-3 weeks ago which has helped but he has a slow stream and a sensation of incomplete emptying.  He has some urgency.   He has some UUI.  He has no SUI.  He has some intermittency and post void dribbling.  He has some dysuria.  His IPSS is 26/6 and his PVR is 69ml.  His UA today has 6-10 WBC's and 3-10 RBC's.  He is not had any UTI's.  He has not had GU surgery.  He reports chronic microhematuria.  He reports a normal PSA.    ROS:  ROS  Allergies  Allergen Reactions  . Latex Other (See Comments)    Pt. States " makes my skin burn"  . Gabapentin Other (See Comments)    Violent, memory loss, loss of appetite.   . Prednisone     hallucination  . Sildenafil Citrate     Chest pain    Past Medical History:  Diagnosis Date  . Anxiety 03/25/2011  . Arthritis   . 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 Complication of anesthesia    "wild when wakes up"  . COPD (chronic obstructive pulmonary disease) ([place])    "slight"  . Depression   . Erectile dysfunction 03/25/2011  . GERD (gastroesophageal reflux disease)    rolaids occ  . Hepatitis C 03/25/2011  . Manic depressive disease manic phase (Michael Fitzgerald)    won't take med  . Night terrors   . Parkinsonism (Michael Fitzgerald)   . RLS (restless legs syndrome)   . Sleep apnea    no cpap  . Sleep walking     Past Surgical History:  Procedure Laterality Date  . COLONOSCOPY  03/25/2011,16  . 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  Marland Kitchen LIVER BIOPSY     Dr. Arlana Pouch at [facility] over 10 yrs ago.   Marland Kitchen PAROTIDECTOMY Left 05/25/2015  . PAROTIDECTOMY Left 05/25/2015   Procedure: LEFT PAROTIDECTOMY;  Surgeon: Izora Gala, MD;  Location: [facility];  Service: ENT;  Laterality: Left;  . TONSILLECTOMY      Social History   Socioeconomic History  . Marital status: Married    Spouse name: Not on file  . Number of children: 2  . Years of education: 98  . Highest education level: High school graduate  Occupational History  . Occupation: Retired  Tobacco Use  . Smoking status: Current Every Day Smoker    Packs/day: 0.50    Years: 30.00    Pack years: 15.00    Types: Cigarettes  . Smokeless tobacco: Never Used  Substance and Sexual Activity  . Alcohol use: No    Comment: quit  . Drug use: Yes    Comment: drugs in past.  Cocaine. 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 2 tatoos on rt lower leg none since age 54.  Marland Kitchen Sexual activity: Not on file  Other Topics Concern  . Not on file  Social History Narrative   Lives at home with  wife,   Right-handed.   3 cups caffeine per day.   Social Determinants of Health   Financial Resource Strain: Not on file  Food Insecurity: Not on file  Transportation Needs: Not on file  Physical Activity: Not on file  Stress: Not on file  Social Connections: Not on file  Intimate Partner Violence: Not on file    Family History  Problem Relation Age of Onset  . Colon cancer Mother   . Other Father        unsure of history    Anti-infectives: Anti-infectives (From admission, onward)   None      Current Outpatient Medications  Medication Sig Dispense Refill  . albuterol (PROVENTIL HFA;VENTOLIN HFA) 108 (90 BASE) MCG/ACT inhaler Inhale into the lungs every 6 (six) hours as needed for wheezing or shortness of breath.    . alfuzosin (UROXATRAL) 10 MG 24 hr tablet Take 10 mg by mouth daily.    Marland Kitchen ALPRAZolam (XANAX) 1 MG tablet Take 1 mg by mouth 2 (two) times daily.     . carbidopa-levodopa (SINEMET IR) 25-100 MG tablet TAKE 2 TABLETS FOUR TIMES DAILY 720 tablet 4  .  donepezil (ARICEPT) 10 MG tablet Take 1 tablet (10 mg total) by mouth at bedtime. 90 tablet 4  . finasteride (PROSCAR) 5 MG tablet Take 5 mg by mouth daily.    . midodrine (PROAMATINE) 5 MG tablet Take 5 mg by mouth 2 (two) times daily.    . pregabalin (LYRICA) 150 MG capsule Take 2 at night 60 capsule 3  . rOPINIRole (REQUIP) 1 MG tablet Take 2 tablets (2 mg total) by mouth at bedtime. (Patient taking differently: Take 3 mg by mouth at bedtime.) 60 tablet 11  . rOPINIRole (REQUIP) 3 MG tablet Take by mouth.    . traZODone (DESYREL) 50 MG tablet Take 50 mg by mouth at bedtime.     No current facility-administered medications for this visit.     Objective: Vital signs in last 24 hours: BP (!) 93/57   Pulse 79   Temp 97.8 F (36.6 C)   Ht 5\' 7"  (1.702 m)   Wt 165 lb (74.8 kg)   BMI 25.84 kg/m   Intake/Output from previous day: No intake/output data recorded. Intake/Output this shift: @IOTHISSHIFT @   Physical Exam Vitals reviewed.  Constitutional:      Appearance: Normal appearance.  Abdominal:     General: Abdomen is flat.     Palpations: Abdomen is soft.     Hernia: No hernia is present.  Genitourinary:    Comments: Normal uncirc phallus with an adequate meatus. Scrotum, testicles and epididymis normal. AP without lesions. NST without mass. Prostate 2+ Michael. SV non-palpable.  Neurological:     Mental Status: He is alert.     Comments: abnormal movements consistent with parkinsons.      Lab Results:  Results for orders placed or performed in visit on 03/08/21 (from the past 24 hour(s))  Urinalysis, Routine w reflex microscopic     Status: Abnormal   Collection Time: 03/08/21  3:35 PM  Result Value Ref Range   Specific Gravity, UA 1.025 1.005 - 1.030   pH, UA 6.5 5.0 - 7.5   Color, UA Yellow Yellow   Appearance Ur Clear Clear   Leukocytes,UA Trace (A) Negative   Protein,UA Negative Negative/Trace   Glucose, UA Negative Negative   Ketones, UA 1+ (A)  Negative   RBC, UA Trace (A) Negative   Bilirubin, UA Negative Negative  Urobilinogen, Ur 1.0 0.2 - 1.0 mg/dL   Nitrite, UA Negative Negative   Microscopic Examination See below:    Narrative   Performed at:  [facility] [address], [place], [place]  [id number] Lab Director: Mina Marble MT, Phone:  [phone]  Microscopic Examination     Status: Abnormal   Collection Time: 03/08/21  3:35 PM   Urine  Result Value Ref Range   WBC, UA 6-10 (A) 0 - 5 /hpf   RBC 3-10 (A) 0 - 2 /hpf   Epithelial Cells (non renal) 0-10 0 - 10 /hpf   Renal Epithel, UA None seen None seen /hpf   Mucus, UA Present Not Estab.   Bacteria, UA None seen None seen/Few   Narrative   Performed at:  [facility] [address] [place], [place]  [id number] Lab Director: [place], Phone:  [phone]    BMET No results for input(s): NA, K, CL, CO2, GLUCOSE, BUN, CREATININE, CALCIUM in the last 72 hours. PT/INR No results for input(s): LABPROT, INR in the last 72 hours. ABG No results for input(s): PHART, HCO3 in the last 72 hours.  Invalid input(s): PCO2, PO2  Studies/Results:  He had a lumbar MRI in 2019 that showed the kidneys.  There are small cysts but no other abnormalities.   Assessment/Plan: BPH with BOO.   He is improving on afluzosin and finasteride and I won't to give him more time for the finasteride to work.  He will return in 3 months for a flowrate and PVR and possible cystoscopy.  PSA today.   Microhematuria.  He reports this is long standing.  I will get a renal US since he just had cysts in the kidneys on MRI in 2019.  Urine culture today.  Parkinson's disease.   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  He may need urodynamics depending on his response to current treatment.   No orders of the defined types were placed in this encounter.    Orders Placed This Encounter  Procedures  . Urine Culture  . Microscopic  Examination  . US RENAL    Standing Status:   Future    Standing Expiration Date:   04/07/2021    Order Specific Question:   Reason for Exam (SYMPTOM  OR DIAGNOSIS REQUIRED)    Answer:   microhematuria and renal cysts.    Order Specific Question:   Preferred imaging location?    Answer:   [facility]  . Urinalysis, Routine w reflex microscopic  . PSA, total and free  . BLADDER SCAN AMB NON-IMAGING     Return in about 3 months (around 06/07/2021) for flowrate and PVR on return.  Possible cystoscopy. .    CC: Dr. Stoney Fitzgerald.      Irine Seal 03/08/2021 [phone]

## 2021-03-09 LAB — PSA, TOTAL AND FREE
PSA, Free Pct: 29 %
PSA, Free: 0.29 ng/mL
Prostate Specific Ag, Serum: 1 ng/mL (ref 0.0–4.0)

## 2021-03-09 NOTE — Progress Notes (Signed)
Letter mailed

## 2021-03-11 LAB — URINE CULTURE

## 2021-03-12 NOTE — Addendum Note (Signed)
Addended by: Dorisann Frames on: 03/12/2021 10:57 AM   Modules accepted: Orders

## 2021-03-15 DIAGNOSIS — G20 Parkinson's disease: Secondary | ICD-10-CM | POA: Diagnosis not present

## 2021-03-15 DIAGNOSIS — N39498 Other specified urinary incontinence: Secondary | ICD-10-CM | POA: Diagnosis not present

## 2021-03-15 DIAGNOSIS — M25551 Pain in right hip: Secondary | ICD-10-CM | POA: Diagnosis not present

## 2021-03-15 DIAGNOSIS — I959 Hypotension, unspecified: Secondary | ICD-10-CM | POA: Diagnosis not present

## 2021-03-15 DIAGNOSIS — Z6825 Body mass index (BMI) 25.0-25.9, adult: Secondary | ICD-10-CM | POA: Diagnosis not present

## 2021-03-15 DIAGNOSIS — J449 Chronic obstructive pulmonary disease, unspecified: Secondary | ICD-10-CM | POA: Diagnosis not present

## 2021-03-15 DIAGNOSIS — Z0000 Encounter for general adult medical examination without abnormal findings: Secondary | ICD-10-CM | POA: Diagnosis not present

## 2021-03-15 DIAGNOSIS — N400 Enlarged prostate without lower urinary tract symptoms: Secondary | ICD-10-CM | POA: Diagnosis not present

## 2021-03-19 DIAGNOSIS — R0602 Shortness of breath: Secondary | ICD-10-CM | POA: Diagnosis not present

## 2021-03-19 DIAGNOSIS — R053 Chronic cough: Secondary | ICD-10-CM | POA: Diagnosis not present

## 2021-03-19 DIAGNOSIS — R059 Cough, unspecified: Secondary | ICD-10-CM | POA: Diagnosis not present

## 2021-03-19 DIAGNOSIS — M25551 Pain in right hip: Secondary | ICD-10-CM | POA: Diagnosis not present

## 2021-03-22 ENCOUNTER — Telehealth: Payer: Self-pay | Admitting: Neurology

## 2021-03-22 MED ORDER — PREGABALIN 150 MG PO CAPS: 300.0000 mg | ORAL_CAPSULE | Freq: Every day | ORAL | 0 refills | Status: DC

## 2021-03-22 NOTE — Telephone Encounter (Signed)
[place] ([place]) called, Pt said he did not receive the package for his  pregabalin (LYRICA) 150 MG capsule. We need permission to replace the controlled substance. Also can you send some to his pharmacy, Pt is out of medication. Would like a call from the nurse. When you call please leave first name and last name.   Contact info: [phone] Ext: [phone]

## 2021-03-23 NOTE — Telephone Encounter (Signed)
Pt has called asking if there is anyway he can have about 4 pills of his pregabpregabalin (LYRICA) 150 MG capsule.  called into his local pharmacy until Monday.  Pt states he really needs this medication,without pt states he is up all night.  Please call

## 2021-03-23 NOTE — Telephone Encounter (Signed)
Medication was sent in yesterday to [facility]. -VRP

## 2021-03-26 NOTE — Telephone Encounter (Signed)
[place] ([facility]) called, have attempted to track pregabalin (LYRICA) 150 MG capsule, Medication still has not been delivered. We need permission to replace the controlled substance. Would like a call from the nurse.  Contact info: [phone] Ext [phone]

## 2021-03-26 NOTE — Telephone Encounter (Signed)
I called Michael Fitzgerald at [facility], needing permission to to resend his pregabalin since 03-06-21 LOST in transit (sent out 03-06-21 usps tracking lost.  Has 3 refills remaining, pt has already paid, does not need prescription just permission to resend replacement.  I [facility] they did not have correct mobile.  I gave to them they Michael Fitzgerald call pt and let him know that it is ready.

## 2021-03-27 NOTE — Telephone Encounter (Signed)
That would be great if Lyrica was not needed at all or dose could be reduced. Agree, may have been too much at 1 time.

## 2021-03-27 NOTE — Telephone Encounter (Signed)
[employer] ([place]) called, have made 3 attempts to contact to get permission to replace Pregabalin. Can contact me direct or call general number.  Contact info: Direct number: [phone] Ext: [phone] General number: [phone]

## 2021-03-27 NOTE — Telephone Encounter (Signed)
I called pt.  He did get the lyrica yesterday. Took 2 caps last night after being off for 4 days.  He woke up real dizzy.  He stated he was not going to take it anymore.  I relayed that taking 2 caps (300mg  pregabalin) after being off may have been too much.  Even though he stated he was not going to take anymore.  He stated after beeing off it he realized he may not need this.  So will stop for now.    He will call [employer] about not replacing this.  (getting money refunded).  I will relay to SS/NP and let her know.

## 2021-04-17 ENCOUNTER — Telehealth: Payer: Self-pay | Admitting: Neurology

## 2021-04-17 ENCOUNTER — Ambulatory Visit: Payer: Medicare PPO | Admitting: Neurology

## 2021-04-17 NOTE — Telephone Encounter (Signed)
Noted  

## 2021-04-17 NOTE — Progress Notes (Deleted)
PATIENT: Michael Fitzgerald DOB: 1951/04/27  REASON FOR VISIT: follow up HISTORY FROM: patient  HISTORY OF PRESENT ILLNESS: Today 04/17/21  HISTORY  Michael Crisp Hankinsis a 70 year old male, accompanied by his wife, seen in refer by neurosurgeon Dr. Brien Few for evaluation of gait abnormality, initial evaluation was on January 29, 2018.  I reviewed and summarized the referring note, he had a history of depression, anxiety, chronic migraine headache.  Around summer of 2018, he noticed gradual onset bilateral lower extremity especially calf area numbness tingling achy pain, he also noticed gradual onset low back pain, lower extremity weakness, was seen by neurosurgeon Dr. Trenton Gammon, later by pain management Dr. Brien Few, received a few rounds of epidural injection failed to improve his symptoms,  He also had MRIs, MRI of the brain showed no significant abnormality, MRI of lumbar and thoracic spine showed mild degenerative changes, but no significant canal foraminal stenosis to explain his gradual worsening gait abnormality,  He no longer has significant low back pain, denying neck pain, but his gait abnormality gradually getting worse since January 2019, he describes difficulty initiate gait, difficulty turning, there is no hand tremor,  Wife also reported some personality change, mild memory loss,  Laboratory evaluation seen March 2019 showed normal C-reactive protein, CPK, ESR, TSH, folic acid, ANA, RPR, vitamin B12  Electrodiagnostic studyon March 29 2019is normal, there is no evidence of large fiber peripheral neuropathy, lumbosacral radiculopathy.  We have personally reviewed MRI of cervical spine in March 2019, mild multilevel degenerative disc disease, variable degree of foraminal narrowing, there was no evidence of nerve root compression or canal stenosis.  UPDATEApril 15, 2019:  His gait abnormality has much improved with Sinemet 25/100 mg 3 times daily, but complains of GI  side effect, he has 7 pound weight loss over the past few weeks  UPDATE Dec 31 2018: Last visit was with [facility] October 2019, he complains of GI side effect with carbidopa levodopa, also complains of low back pain receiving epidural injection  He is with his daughter [name] and sister [name] today's clinical visit, he had acute worsening over the past few months, increased confusion, gait abnormality, difficulty sleeping, episode of jumping pain throughout his body,  He was seen by emergency room, add on Mirapex 1 mg every night, gabapentin, with no significant change, making very sleepy, seems to be more agitated, confused, take frequent naps during the day, he also has worsening memory loss,  We personally reviewed MRI films,MRI of the brain January 2019, no acute abnormality,, MRI cervical spine March 2019, mild degenerative changes no significant canal or foraminal narrowing.  MRI of thoracic, lumbar spine in 2018, mild degenerative changes no evidence of nerve root compression.  Virtual Visit via Video on Apr 01 2019 He is taking carbidopa levodopa 25/100 mg 2 tablets 3 times a day, which has controlled his Parkinson's symptoms much better, he is tolerating Aricept 10 mg daily, Seroquel 25 mg every night," I have not feel this good for a long time"  UPDATE Sep 14 2019: He is accompanied by his sister at today's clinic visit, he used to take oxycodone 5 mg 3 times a day for chronic low back pain, he stopped it abruptly around September 2020, he went through withdrawal, hallucinations, diarrhea, also develop uncontrollable leg shaking, which is still present to now, over the past few days, he has been take large dose of Sinemet up to 18 tablets a day, which provide temporary relief, pacing around, taking a hot shower  also. provide temporary relief, he described uncontrollable bilateral lower extremity jumpy movement, feel like electricity shock, very painful,  UPDATE Jan 10 2020: He is overall doing much better  He is currently taking Sinemet 25/100 mg 2 tablets 4 times a day at 6, 12, 6, and 12 AM, Lyrica 150 mg 3 times a day, also Seroquel 25 mg 2 tablets every night, he continue complains of difficulty sleeping, frequent leg jerking movement, bilateral lower extremity muscle tightness, sometimes wake up from sleep due to leg pain, discomfort,  Personally reviewed data scan in February 2021, asymmetric bilateral reduction in radiotracer activity within the posterior striata, right greater than left, in a pattern typical of Parkinson's syndrome pathology  Update Apr 11, 2020 SS: When last seen, Sinemet was decreased 25/100 mg, 2 tablets 3 times daily (vs 4), Lyrica changed to 150 mg am/300 mg pm, added on Requip 1 mg 2 tablets at bedtime.  Indicates there is no way he could decrease his dose of Sinemet to just 3 times a day, has continued taking 2 tablets 4 times daily at 6 a, 12 p, 6 p, 12 a.  Has decreased Lyrica to just 300 mg at bedtime, doing well with Requip.  Indicates medications are doing well, he is feeling well.  His back pain is fairly well controlled.  No longer taking Seroquel.  He has had some episodes of low blood pressure, his PCP has him on midodrine 5 mg twice a day. He had 1 episode of black out few weeks ago, stood up too fast from sleep.  He is feeling more like himself, he took the boat out and went fishing, previously wouldn't have felt confident enough.  Presents today for evaluation accompanied by his wife. Memory stable, on aricept.   Update 10/16/2020 SS: Here today with his sister, has been doing well.  Taking Lyrica 300 mg at bedtime, Sinemet 25-100 mg 2 tablet 4 times daily, on Requip 3 mg (2 tablets at bedtime from PCP), midodrine 5 mg once daily (PCP), Aricept 10 mg daily.  No falls, may have some bad days, where legs feel heavy, worries medications don't work, no freezing. Would like to transfer Lyrica to local pharmacy in the future,  hard time getting through mail order. Anxiety on Xanax. Is currently pleased.   Update Apr 17, 2020 SS:  REVIEW OF SYSTEMS: Out of a complete 14 system review of symptoms, the patient complains only of the following symptoms, and all other reviewed systems are negative.  N/A  ALLERGIES: Allergies  Allergen Reactions  . Latex Other (See Comments)    Pt. States " makes my skin burn"  . Gabapentin Other (See Comments)    Violent, memory loss, loss of appetite.   . Prednisone     hallucination  . Sildenafil Citrate     Chest pain    HOME MEDICATIONS: Outpatient Medications Prior to Visit  Medication Sig Dispense Refill  . albuterol (PROVENTIL HFA;VENTOLIN HFA) 108 (90 BASE) MCG/ACT inhaler Inhale into the lungs every 6 (six) hours as needed for wheezing or shortness of breath.    . alfuzosin (UROXATRAL) 10 MG 24 hr tablet Take 10 mg by mouth daily.    Marland Kitchen ALPRAZolam (XANAX) 1 MG tablet Take 1 mg by mouth 2 (two) times daily.     . carbidopa-levodopa (SINEMET IR) 25-100 MG tablet TAKE 2 TABLETS FOUR TIMES DAILY 720 tablet 4  . donepezil (ARICEPT) 10 MG tablet Take 1 tablet (10 mg total) by mouth at  bedtime. 90 tablet 4  . finasteride (PROSCAR) 5 MG tablet Take 5 mg by mouth daily.    . midodrine (PROAMATINE) 5 MG tablet Take 5 mg by mouth 2 (two) times daily.    . pregabalin (LYRICA) 150 MG capsule Take 2 at night 60 capsule 3  . pregabalin (LYRICA) 150 MG capsule Take 2 capsules (300 mg total) by mouth at bedtime. 60 capsule 0  . rOPINIRole (REQUIP) 1 MG tablet Take 2 tablets (2 mg total) by mouth at bedtime. (Patient taking differently: Take 3 mg by mouth at bedtime.) 60 tablet 11  . rOPINIRole (REQUIP) 3 MG tablet Take by mouth.    . traZODone (DESYREL) 50 MG tablet Take 50 mg by mouth at bedtime.     No facility-administered medications prior to visit.    PAST MEDICAL HISTORY: Past Medical History:  Diagnosis Date  . Anxiety 03/25/2011  . Arthritis   . 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 Complication of anesthesia    "wild when wakes up"  . COPD (chronic obstructive pulmonary disease) ([place])    "slight"  . Depression   . Erectile dysfunction 03/25/2011  . GERD (gastroesophageal reflux disease)    rolaids occ  . Hepatitis C 03/25/2011  . 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 Night terrors   . Parkinsonism ([place])   . RLS (restless legs syndrome)   . Sleep apnea    no cpap  . Sleep walking     PAST SURGICAL HISTORY: Past Surgical History:  Procedure Laterality Date  . COLONOSCOPY  03/25/2011,16  . 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  Marland Kitchen LIVER BIOPSY     Dr. Arlana Pouch at [facility] over 10 yrs ago.  Marland Kitchen PAROTIDECTOMY Left 05/25/2015  . PAROTIDECTOMY Left 05/25/2015   Procedure: LEFT PAROTIDECTOMY;  Surgeon: Izora Gala, MD;  Location: [facility];  Service: ENT;  Laterality: Left;  . TONSILLECTOMY      FAMILY HISTORY: Family History  Problem Relation Age of Onset  . Colon cancer Mother   . Other Father        unsure of history    SOCIAL HISTORY: Social History   Socioeconomic History  . Marital status: Married    Spouse name: Not on file  . Number of children: 2  . Years of education: 55  . Highest education level: High school graduate  Occupational History  . Occupation: Retired  Tobacco Use  . Smoking status: Current Every Day Smoker    Packs/day: 0.50    Years: 30.00    Pack years: 15.00    Types: Cigarettes  . Smokeless tobacco: Never Used  Substance and Sexual Activity  . Alcohol use: No    Comment: quit  . Drug use: Yes    Comment: drugs in past.  Cocaine. 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 2 tatoos on rt lower leg none since age 62.  Marland Kitchen Sexual activity: Not on file  Other Topics Concern  . Not on file  Social History Narrative   Lives at home with wife,   Right-handed.   3 cups caffeine per day.   Social Determinants of Health    Financial Resource Strain: Not on file  Food Insecurity: Not on file  Transportation Needs: Not on file  Physical Activity: Not on file  Stress: Not on file  Social Connections: Not on file  Intimate Partner Violence: Not on file   PHYSICAL EXAM  There were no vitals filed for this visit. There is no height or weight on file to calculate BMI.  Generalized: Well developed, in no acute distress   Neurological examination  Mentation: Alert oriented to time, place, history taking. Follows all commands speech and language fluent Cranial nerve II-XII: Pupils were equal round reactive to light. Extraocular movements were full, visual field were full on confrontational test. Facial sensation and strength were normal. Head turning and shoulder shrug  were normal and symmetric. Motor: The motor testing reveals 5 over 5 strength of all 4 extremities. Good symmetric motor tone is noted throughout.  No tremor noted, no bradykinesia or rigidity on exam, movements are swift. Sensory: Sensory testing is intact to soft touch on all 4 extremities. No evidence of extinction is noted.  Coordination: Cerebellar testing reveals good finger-nose-finger and heel-to-shin bilaterally.  Gait and station: Stand from seated position without pushoff, gait is quick, good arm swing, smooth turns Reflexes: Deep tendon reflexes are symmetric and normal bilaterally.   DIAGNOSTIC DATA (LABS, IMAGING, TESTING) - I reviewed patient records, labs, notes, testing and imaging myself where available.  Lab Results  Component Value Date   WBC 7.6 09/14/2019   HGB 13.2 09/14/2019   HCT 39.8 09/14/2019   MCV 98 (H) 09/14/2019   PLT 193 05/17/2015      Component Value Date/Time   NA 140 09/14/2019 1521   K 4.4 09/14/2019 1521   CL 105 09/14/2019 1521   CO2 22 09/14/2019 1521   GLUCOSE 124 (H) 09/14/2019 1521   GLUCOSE 96 05/17/2015 1512   BUN 13 09/14/2019 1521   CREATININE 0.93 09/14/2019 1521   CALCIUM 9.1  09/14/2019 1521   PROT 6.5 09/14/2019 1521   ALBUMIN 4.1 09/14/2019 1521   AST 12 09/14/2019 1521   ALT 4 09/14/2019 1521   ALKPHOS 101 09/14/2019 1521   BILITOT 0.3 09/14/2019 1521   GFRNONAA 84 09/14/2019 1521   GFRAA 97 09/14/2019 1521   No results found for: CHOL, HDL, LDLCALC, LDLDIRECT, TRIG, CHOLHDL No results found for: HGBA1C Lab Results  Component Value Date   VITAMINB12 203 (L) 09/14/2019   Lab Results  Component Value Date   TSH 0.378 (L) 09/14/2019    ASSESSMENT AND PLAN 70 y.o. 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 here with:  1.  Parkinson's disease 2.  Restless leg syndrome -Overall stable with current medications -Continue Sinemet 25-100 mg, 2 tablets 4 times a day ("no way" he could drop down to 3 times daily dosing" -Continue Lyrica 150 mg, 2 tablets at bedtime (should have 1 refill left from [facility], wants future refills sent to local pharmacy, [facility]) -On Requip 3 mg, 2 tablets at bedtime from PCP -Off Seroquel at night, takes Xanax 2 times daily from PCP -Follow-up in 6 months or sooner if needed  3.  Memory loss -Last MMSE 30/30, check next visit -Continue Aricept 10 mg at bedtime  I spent 30 minutes of face-to-face and non-face-to-face time with patient.  This included previsit chart review, lab review, study review, order entry, electronic health record documentation, patient education.  Judson Roch  Dian Situ, DNP 04/17/2021, 5:44 AM [facility] [address], [place], [place] [address] [phone]

## 2021-04-17 NOTE — Telephone Encounter (Signed)
Pt called to cancel appt for today, sister coming to take him to ED. Pt think he has been exposed to Covid. Having symptoms of glands swollen; glands hurt to touch, some blood coming out of left ear, can not really swallow.

## 2021-05-09 DIAGNOSIS — G2581 Restless legs syndrome: Secondary | ICD-10-CM | POA: Diagnosis not present

## 2021-05-09 DIAGNOSIS — F0390 Unspecified dementia without behavioral disturbance: Secondary | ICD-10-CM | POA: Diagnosis not present

## 2021-05-09 DIAGNOSIS — G20 Parkinson's disease: Secondary | ICD-10-CM | POA: Diagnosis not present

## 2021-05-09 DIAGNOSIS — G903 Multi-system degeneration of the autonomic nervous system: Secondary | ICD-10-CM | POA: Diagnosis not present

## 2021-05-09 DIAGNOSIS — J449 Chronic obstructive pulmonary disease, unspecified: Secondary | ICD-10-CM | POA: Diagnosis not present

## 2021-05-09 DIAGNOSIS — N400 Enlarged prostate without lower urinary tract symptoms: Secondary | ICD-10-CM | POA: Diagnosis not present

## 2021-05-17 ENCOUNTER — Ambulatory Visit: Payer: Medicare PPO | Admitting: Urology

## 2021-05-17 ENCOUNTER — Other Ambulatory Visit: Payer: Self-pay

## 2021-05-17 ENCOUNTER — Encounter: Payer: Self-pay | Admitting: Urology

## 2021-05-17 VITALS — BP 110/71 | HR 78 | Temp 97.7°F | Wt 158.0 lb

## 2021-05-17 DIAGNOSIS — R3912 Poor urinary stream: Secondary | ICD-10-CM

## 2021-05-17 DIAGNOSIS — R3129 Other microscopic hematuria: Secondary | ICD-10-CM

## 2021-05-17 DIAGNOSIS — N401 Enlarged prostate with lower urinary tract symptoms: Secondary | ICD-10-CM

## 2021-05-17 DIAGNOSIS — R351 Nocturia: Secondary | ICD-10-CM

## 2021-05-17 DIAGNOSIS — R3915 Urgency of urination: Secondary | ICD-10-CM

## 2021-05-17 LAB — URINALYSIS, ROUTINE W REFLEX MICROSCOPIC
Bilirubin, UA: NEGATIVE
Glucose, UA: NEGATIVE
Nitrite, UA: NEGATIVE
Specific Gravity, UA: 1.025 (ref 1.005–1.030)
Urobilinogen, Ur: 0.2 mg/dL (ref 0.2–1.0)
pH, UA: 6 (ref 5.0–7.5)

## 2021-05-17 LAB — MICROSCOPIC EXAMINATION
Epithelial Cells (non renal): NONE SEEN /hpf (ref 0–10)
Renal Epithel, UA: NONE SEEN /hpf

## 2021-05-17 MED ORDER — MIRABEGRON ER 25 MG PO TB24: 25.0000 mg | ORAL_TABLET | Freq: Every day | ORAL | 0 refills | Status: DC

## 2021-05-17 MED ORDER — ALFUZOSIN HCL ER 10 MG PO TB24: 10.0000 mg | ORAL_TABLET | Freq: Every day | ORAL | 11 refills | Status: DC

## 2021-05-17 MED ORDER — CIPROFLOXACIN HCL 500 MG PO TABS
500.0000 mg | ORAL_TABLET | Freq: Once | ORAL | Status: AC
  Administered 2021-05-17: 500 mg via ORAL

## 2021-05-17 MED ORDER — FINASTERIDE 5 MG PO TABS: 5.0000 mg | ORAL_TABLET | Freq: Every day | ORAL | 11 refills | Status: DC

## 2021-05-17 NOTE — Progress Notes (Signed)
Uroflow  Peak Flow: 89ml Average Flow: -91ml Voided Volume: 1ml Voiding Time: 1sec Flow Time: 1sec Time to Peak Flow: 0sec    Uroflow #2  Peak Flow: 30ml Average Flow: 70ml Voided Volume: 36ml Voiding Time: 31sec Flow Time: 23sec Time to Peak Flow: 1sec

## 2021-05-17 NOTE — Progress Notes (Signed)
Urological Symptom Review  Patient is experiencing the following symptoms: Burning/pain with urination Get up at night to urinate Leakage of urine Stream starts and stops Trouble starting stream Have to strain to urinate Blood in urine Injury to kidneys/bladder Erection problems (male only)   Review of Systems  Gastrointestinal (upper)  : Negative for upper GI symptoms  Gastrointestinal (lower) : Constipation  Constitutional : Weight loss Fatigue  Skin: Negative for skin symptoms  Eyes: Blurred vision Double vision  Ear/Nose/Throat : Negative for Ear/Nose/Throat symptoms  Hematologic/Lymphatic: Easy bruising  Cardiovascular : Negative for cardiovascular symptoms  Respiratory : Cough Shortness of breath  Endocrine: Negative for endocrine symptoms  Musculoskeletal: Back pain Joint pain  Neurological: Dizziness  Psychologic: Depression Anxiety

## 2021-05-17 NOTE — Progress Notes (Signed)
Subjective: 1. Benign localized prostatic hyperplasia with lower urinary tract symptoms (LUTS)   2. Weak urinary stream   3. Urgency of urination   4. Nocturia   5. Microhematuria      05/17/21: Michael Fitzgerald returns today in f/u.  His voiding symptoms have gotten worse on the afluzosin and finasteride and he has nocturia x 2.  He continues to have some UUI.  His IPSS is 33.  His culture was negative on 4/21.    03/08/21: Michael Fitzgerald is a 70 yo male with Parkinson's disease who is sent for evaluation of lower urinary tract symptoms.  He has been on finasteride and alfuzosin about 2-3 weeks ago which has helped but he has a slow stream and a sensation of incomplete emptying.  He has some urgency.   He has some UUI.  He has no SUI.  He has some intermittency and post void dribbling.  He has some dysuria.  His IPSS is 26/6 and his PVR is 75ml.  His UA today has 6-10 WBC's and 3-10 RBC's.  He is not had any UTI's.  He has not had GU surgery.  He reports chronic microhematuria.  He reports a normal PSA.    IPSS     Row Name 05/17/21 1400         International Prostate Symptom Score   How often have you had the sensation of not emptying your bladder? Almost always     How often have you had to urinate less than every two hours? More than half the time     How often have you found you stopped and started again several times when you urinated? Almost always     How often have you found it difficult to postpone urination? Almost always     How often have you had a weak urinary stream? Almost always     How often have you had to strain to start urination? Almost always     How many times did you typically get up at night to urinate? 4 Times     Total IPSS Score 33           Quality of Life due to urinary symptoms     If you were to spend the rest of your life with your urinary condition just the way it is now how would you feel about that? Unhappy             ROS:  ROS  Allergies  Allergen  Reactions   Latex Other (See Comments)    Pt. States " makes my skin burn"   Gabapentin Other (See Comments)    Violent, memory loss, loss of appetite.    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   LIVER BIOPSY     Dr. Arlana Pouch at [place] over 10 yrs ago.   PAROTIDECTOMY Left 05/25/2015   PAROTIDECTOMY Left 05/25/2015   Procedure: LEFT PAROTIDECTOMY;  Surgeon: Izora Gala, MD;  Location: [facility] OR;  Service: ENT;  Laterality: Left;   TONSILLECTOMY      Social History   Socioeconomic History   Marital status: Married    Spouse name: Not on file   Number of children: 2   Years of education: 12   Highest education level: High school graduate  Occupational History   Occupation: Retired  Tobacco Use   Smoking status: Every Day    Packs/day: 0.50    Years: 30.00    Pack years: 15.00    Types: Cigarettes   Smokeless tobacco: Never  Substance and Sexual Activity   Alcohol use: No    Comment: quit   Drug use: Yes    Comment: drugs in past.  Cocaine. 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 2 tatoos on rt lower leg none since age 43.   Sexual activity: Not on file  Other Topics Concern   Not on file  Social History Narrative    Lives at home with wife,   Right-handed.   3 cups caffeine per day.   Social Determinants of Health   Financial Resource Strain: Not on file  Food Insecurity: Not on file  Transportation Needs: Not on file  Physical Activity: Not on file  Stress: Not on file  Social Connections: Not on file  Intimate Partner Violence: Not on file    Family History  Problem Relation Age of Onset   Colon cancer Mother    Other Father        unsure of history    Anti-infectives: Anti-infectives (From admission, onward)    Start     Dose/Rate Route Frequency Ordered Stop   05/17/21 1445  CIPROFLOXACIN HCL 500 MG PO TABS        500 mg Oral  Once 05/17/21 1430 05/17/21 1447       Current Outpatient Medications  Medication Sig Dispense Refill   mirabegron ER (MYRBETRIQ) 25 MG TB24 tablet Take 1 tablet (25 mg total) by mouth daily. 28 tablet 0   albuterol (PROVENTIL HFA;VENTOLIN HFA) 108 (90 BASE) MCG/ACT inhaler Inhale into the lungs every 6 (six) hours as needed for wheezing or shortness of breath.     alfuzosin (UROXATRAL) 10 MG 24 hr tablet Take 1 tablet (10 mg total) by mouth daily. 30 tablet 11   ALPRAZolam (XANAX) 1 MG tablet Take 1 mg by mouth 2 (two) times daily.      carbidopa-levodopa (SINEMET IR) 25-100 MG tablet TAKE 2 TABLETS FOUR TIMES DAILY 720 tablet 4   donepezil (ARICEPT) 10 MG tablet Take 1 tablet (10 mg total) by mouth at bedtime. 90 tablet 4   finasteride (PROSCAR) 5 MG tablet Take 1 tablet (5 mg total) by mouth daily. 30 tablet 11   midodrine (PROAMATINE) 5 MG tablet Take 5 mg by mouth 2 (two) times daily.     pregabalin (LYRICA) 150 MG capsule Take 2 at night 60 capsule 3   pregabalin (LYRICA) 150 MG capsule Take 2 capsules (300 mg total) by mouth at bedtime. 60 capsule  0   rOPINIRole (REQUIP) 1 MG tablet Take 2 tablets (2 mg total) by mouth at bedtime. (Patient taking differently: Take 3 mg by mouth at bedtime.) 60 tablet 11   rOPINIRole (REQUIP) 3 MG tablet Take by  mouth.     traZODone (DESYREL) 50 MG tablet Take 50 mg by mouth at bedtime.     No current facility-administered medications for this visit.     Objective: Vital signs in last 24 hours: BP 110/71   Pulse 78   Temp 97.7 F (36.5 C)   Wt 158 lb (71.7 kg)   BMI 24.75 kg/m   Intake/Output from previous day: No intake/output data recorded. Intake/Output this shift: @IOTHISSHIFT @   Physical Exam  Lab Results:  Results for orders placed or performed in visit on 05/17/21 (from the past 24 hour(s))  Urinalysis, Routine w reflex microscopic     Status: Abnormal   Collection Time: 05/17/21  2:28 PM  Result Value Ref Range   Specific Gravity, UA 1.025 1.005 - 1.030   pH, UA 6.0 5.0 - 7.5   Color, UA Yellow Yellow   Appearance Ur Clear Clear   Leukocytes,UA Trace (A) Negative   Protein,UA 1+ (A) Negative/Trace   Glucose, UA Negative Negative   Ketones, UA 1+ (A) Negative   RBC, UA Trace (A) Negative   Bilirubin, UA Negative Negative   Urobilinogen, Ur 0.2 0.2 - 1.0 mg/dL   Nitrite, UA Negative Negative   Microscopic Examination See below:    Narrative   Performed at:  [facility] [address] [place], [place]  [id number] Lab Director: Mina Marble MT, Phone:  [phone]  Microscopic Examination     Status: None   Collection Time: 05/17/21  2:28 PM   Urine  Result Value Ref Range   WBC, UA 0-5 0 - 5 /hpf   RBC 0-2 0 - 2 /hpf   Epithelial Cells (non renal) None seen 0 - 10 /hpf   Renal Epithel, UA None seen None seen /hpf   Mucus, UA Present Not Estab.   Bacteria, UA Few None seen/Few   Narrative   Performed at:  [facility] [address], [place], [place]  [id number] Lab Director: [place], Phone:  [phone]    BMET No results for input(s): NA, K, CL, CO2, GLUCOSE, BUN, CREATININE, CALCIUM in the last 72 hours. PT/INR No results for input(s): LABPROT, INR in the last 72 hours. ABG No results for input(s): PHART, HCO3  in the last 72 hours.  Invalid input(s): PCO2, PO2 UA is clear Studies/Results: Procedure: Flexible cystoscopy.  He was prepped with betadine and 2% lidocaine jelly.  He was given Cipro 500mg  po.  The scope was passed.  The urethral is normal.  The sphincter is intact.  He has trilobar hyperplasia with a small but ball valving middle lobe. There was minimal PVR.  He has moderate to severe trabeculation with multiple cellules but no tumors, stones or inflammation.  The UO's are normal.  The prostatic mucosa bled easily at the bladder neck.  There were no complications.    Assessment/Plan: BPH with BOO with OAB.  His symptoms remains severe on alfuzosin and finasteride.  His stream is slow .  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   I discussed the options for treatment including OAB meds vs minimally invasive treatments vs a TURP which I think will be the most effective and I have reviewed the risks and benefits.  He wants to think on it.   Microhematuria.  He reports this is long standing.  I will reorder the renal US since he just had cysts in the kidneys on MRI in 2019.    Parkinson's disease.   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    Meds ordered this encounter  Medications   ciprofloxacin (CIPRO) tablet 500 mg   alfuzosin (UROXATRAL) 10 MG 24 hr tablet    Sig: Take 1 tablet (10 mg total) by mouth daily.    Dispense:  30 tablet    Refill:  11   finasteride (PROSCAR) 5 MG tablet    Sig: Take 1 tablet (5 mg total) by mouth daily.    Dispense:  30 tablet    Refill:  11   mirabegron ER (MYRBETRIQ) 25 MG TB24 tablet    Sig: Take 1 tablet (25 mg total) by mouth daily.    Dispense:  28 tablet    Refill:  0     Orders Placed This Encounter  Procedures   Microscopic Examination   Urinalysis, Routine w reflex microscopic   BLADDER SCAN AMB NON-IMAGING     No follow-ups on file.    CC: Dr. Stoney Bang.      Irine Seal 05/17/2021 [phone]

## 2021-05-20 ENCOUNTER — Other Ambulatory Visit: Payer: Self-pay | Admitting: Neurology

## 2021-05-23 NOTE — Telephone Encounter (Signed)
Please call to make F/U appt.

## 2021-06-14 ENCOUNTER — Ambulatory Visit (HOSPITAL_COMMUNITY)
Admission: RE | Admit: 2021-06-14 | Discharge: 2021-06-14 | Disposition: A | Discharge status: Home / Self Care | Payer: Medicare PPO | Source: Ambulatory Visit | Attending: Urology | Admitting: Urology

## 2021-06-14 ENCOUNTER — Other Ambulatory Visit: Payer: Self-pay

## 2021-06-14 DIAGNOSIS — R3129 Other microscopic hematuria: Secondary | ICD-10-CM | POA: Insufficient documentation

## 2021-06-14 DIAGNOSIS — R319 Hematuria, unspecified: Secondary | ICD-10-CM | POA: Diagnosis not present

## 2021-06-14 DIAGNOSIS — N281 Cyst of kidney, acquired: Secondary | ICD-10-CM | POA: Diagnosis not present

## 2021-06-19 DIAGNOSIS — I700 Atherosclerosis of aorta: Secondary | ICD-10-CM | POA: Diagnosis not present

## 2021-06-19 DIAGNOSIS — N400 Enlarged prostate without lower urinary tract symptoms: Secondary | ICD-10-CM | POA: Diagnosis not present

## 2021-06-19 DIAGNOSIS — G40909 Epilepsy, unspecified, not intractable, without status epilepticus: Secondary | ICD-10-CM | POA: Diagnosis not present

## 2021-06-19 DIAGNOSIS — M25551 Pain in right hip: Secondary | ICD-10-CM | POA: Diagnosis not present

## 2021-06-19 DIAGNOSIS — I959 Hypotension, unspecified: Secondary | ICD-10-CM | POA: Diagnosis not present

## 2021-06-19 DIAGNOSIS — Z6823 Body mass index (BMI) 23.0-23.9, adult: Secondary | ICD-10-CM | POA: Diagnosis not present

## 2021-06-19 DIAGNOSIS — N39498 Other specified urinary incontinence: Secondary | ICD-10-CM | POA: Diagnosis not present

## 2021-06-19 DIAGNOSIS — J449 Chronic obstructive pulmonary disease, unspecified: Secondary | ICD-10-CM | POA: Diagnosis not present

## 2021-06-19 DIAGNOSIS — G20 Parkinson's disease: Secondary | ICD-10-CM | POA: Diagnosis not present

## 2021-06-20 ENCOUNTER — Encounter (INDEPENDENT_AMBULATORY_CARE_PROVIDER_SITE_OTHER): Payer: Self-pay | Admitting: Gastroenterology

## 2021-06-21 ENCOUNTER — Other Ambulatory Visit: Payer: Self-pay

## 2021-06-21 ENCOUNTER — Encounter: Payer: Self-pay | Admitting: Urology

## 2021-06-21 ENCOUNTER — Ambulatory Visit: Payer: Medicare PPO | Admitting: Urology

## 2021-06-21 VITALS — BP 81/63 | HR 85

## 2021-06-21 DIAGNOSIS — R351 Nocturia: Secondary | ICD-10-CM | POA: Diagnosis not present

## 2021-06-21 DIAGNOSIS — N401 Enlarged prostate with lower urinary tract symptoms: Secondary | ICD-10-CM | POA: Diagnosis not present

## 2021-06-21 DIAGNOSIS — I951 Orthostatic hypotension: Secondary | ICD-10-CM

## 2021-06-21 DIAGNOSIS — R3915 Urgency of urination: Secondary | ICD-10-CM | POA: Diagnosis not present

## 2021-06-21 DIAGNOSIS — R3912 Poor urinary stream: Secondary | ICD-10-CM

## 2021-06-21 LAB — BLADDER SCAN AMB NON-IMAGING: Scan Result: 23

## 2021-06-21 MED ORDER — FINASTERIDE 5 MG PO TABS: 5.0000 mg | ORAL_TABLET | Freq: Every day | ORAL | 3 refills | Status: DC

## 2021-06-21 MED ORDER — MIRABEGRON ER 50 MG PO TB24: 50.0000 mg | ORAL_TABLET | Freq: Every day | ORAL | 0 refills | Status: DC

## 2021-06-21 NOTE — Progress Notes (Signed)
Subjective: 1. Benign localized prostatic hyperplasia with lower urinary tract symptoms (LUTS)   2. Weak urinary stream   3. Urgency of urination   4. Nocturia   5. Orthostatic hypotension    06/21/21: Michael Fitzgerald returns today to assess his response to the addition of Myrbetriq to the alfuzosin and finasteride.  He hasn't noticed much benefit and his IPSS is 34.  His PVR is 84m.  He was unable to void today.  He is on a new med for Parkinson's, Rasagiline.   05/17/21: Michael Fitzgerald today in f/u.  His voiding symptoms have gotten worse on the afluzosin and finasteride and he has nocturia x 2.  He continues to have some UUI.  His IPSS is 33.  His culture was negative on 4/21.    03/08/21: [name] a 70yo male with Parkinson's disease who is sent for evaluation of lower urinary tract symptoms.  He has been on finasteride and alfuzosin about 2-3 weeks ago which has helped but he has a slow stream and a sensation of incomplete emptying.  He has some urgency.   He has some UUI.  He has no SUI.  He has some intermittency and post void dribbling.  He has some dysuria.  His IPSS is 26/6 and his PVR is 356m  His UA today has 6-10 WBC's and 3-10 RBC's.  He is not had any UTI's.  He has not had GU surgery.  He reports chronic microhematuria.  He reports a normal PSA.    IPSS     Row Name 06/21/21 1400         International Prostate Symptom Score   How often have you had the sensation of not emptying your bladder? Almost always     How often have you had to urinate less than every two hours? Almost always     How often have you found you stopped and started again several times when you urinated? Almost always     How often have you found it difficult to postpone urination? Almost always     How often have you had a weak urinary stream? Almost always     How often have you had to strain to start urination? Almost always     How many times did you typically get up at night to urinate? 4 Times      Total IPSS Score 34           Quality of Life due to urinary symptoms     If you were to spend the rest of your life with your urinary condition just the way it is now how would you feel about that? Terrible              ROS:  ROS  Allergies  Allergen Reactions   Latex Other (See Comments)    Pt. States " makes my skin burn"   Gabapentin Other (See Comments)    Violent, memory loss, loss of appetite.    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   LIVER BIOPSY     Dr. Arlana Pouch at [place] over 10 yrs ago.   PAROTIDECTOMY Left 05/25/2015   PAROTIDECTOMY Left 05/25/2015   Procedure: LEFT PAROTIDECTOMY;  Surgeon: Izora Gala, MD;  Location: [facility] OR;  Service: ENT;  Laterality: Left;   TONSILLECTOMY      Social History   Socioeconomic History   Marital status: Married    Spouse name: Not on file   Number of children: 2   Years of education: 12   Highest education level: High school graduate  Occupational History   Occupation: Retired  Tobacco Use   Smoking status: Every Day    Packs/day: 0.50    Years: 30.00    Pack years: 15.00    Types: Cigarettes   Smokeless tobacco: Never  Substance and Sexual  Activity   Alcohol use: No    Comment: quit   Drug use: Yes    Comment: drugs in past.  Cocaine. 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 2 tatoos on rt lower leg none since age 6.   Sexual activity: Not on file  Other Topics Concern   Not on file  Social History Narrative   Lives at home with wife,   Right-handed.   3 cups caffeine per day.   Social Determinants of Health   Financial Resource Strain: Not on file  Food Insecurity: Not on file  Transportation Needs: Not on file  Physical Activity: Not on file  Stress: Not on file  Social Connections: Not on file  Intimate Partner Violence: Not on file    Family History  Problem Relation Age of Onset   Colon cancer Mother    Other Father        unsure of history    Anti-infectives: Anti-infectives (From admission, onward)    None       Current Outpatient Medications  Medication Sig Dispense Refill   albuterol (PROVENTIL HFA;VENTOLIN HFA) 108 (90 BASE) MCG/ACT inhaler Inhale into the lungs every 6 (six) hours as needed for wheezing or shortness of breath.     ALPRAZolam (XANAX) 1 MG tablet Take 1 mg by mouth 2 (two) times daily.      carbidopa-levodopa (SINEMET IR) 25-100 MG tablet TAKE 2 TABLETS FOUR TIMES DAILY 720 tablet 4   carbidopa-levodopa-entacapone (STALEVO) 31.25-125-200 MG tablet Take 2 tablets by mouth 2 (two) times daily.     divalproex (DEPAKOTE ER) 500 MG 24 hr tablet Take by mouth.     donepezil (ARICEPT) 10 MG tablet TAKE 1 TABLET EVERY DAY AT BEDTIME 90 tablet 1   midodrine (PROAMATINE) 5 MG tablet Take 5 mg by mouth 2 (two) times daily.     mirabegron ER (MYRBETRIQ) 50 MG TB24 tablet Take 1 tablet (50 mg total) by mouth daily. 28 tablet 0   pregabalin (LYRICA) 150 MG capsule Take 2 at night 60 capsule 3   pregabalin (LYRICA) 150 MG capsule Take 2 capsules (300 mg total) by  mouth at bedtime. 60 capsule 0   rasagiline (AZILECT) 0.5 MG TABS tablet Take 0.5 mg by mouth daily as needed.     rOPINIRole (REQUIP) 1 MG tablet  Take 2 tablets (2 mg total) by mouth at bedtime. (Patient taking differently: Take 3 mg by mouth at bedtime.) 60 tablet 11   rOPINIRole (REQUIP) 3 MG tablet Take by mouth.     traZODone (DESYREL) 50 MG tablet Take 50 mg by mouth at bedtime.     finasteride (PROSCAR) 5 MG tablet Take 1 tablet (5 mg total) by mouth daily. 90 tablet 3   No current facility-administered medications for this visit.     Objective: Vital signs in last 24 hours: BP (!) 81/63   Pulse 85   Intake/Output from previous day: No intake/output data recorded. Intake/Output this shift: '@IOTHISSHIFT'$ @   Physical Exam  Lab Results:  No results found for this or any previous visit (from the past 24 hour(s)).   BMET No results for input(s): NA, K, CL, CO2, GLUCOSE, BUN, CREATININE, CALCIUM in the last 72 hours. PT/INR No results for input(s): LABPROT, INR in the last 72 hours. ABG No results for input(s): PHART, HCO3 in the last 72 hours.  Invalid input(s): PCO2, PO2 UA is clear Studies/Results:  Renal US on 06/14/21 showed small bilateral renal cysts but no other abnormalities.    Assessment/Plan: BPH with BOO with OAB.  His symptoms remains severe on alfuzosin and finasteride.  He has gotten some improvement with Myrbetriq '25mg'$ .    He has been having orthostatic hypotension as well.   I will stop the afluzosin and increase the Myrbetriq to '50mg'$ .    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   We have discussed minimally invasive therapy and TURP but he wants to stay with the meds.    Microhematuria.  He reports this is long standing. Renal US shows small cysts which are chronic.   Parkinson's disease.   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    Meds ordered this encounter  Medications   finasteride (PROSCAR) 5 MG tablet    Sig: Take 1 tablet (5 mg total) by mouth daily.    Dispense:  90 tablet    Refill:  3   mirabegron ER (MYRBETRIQ) 50 MG TB24 tablet     Sig: Take 1 tablet (50 mg total) by mouth daily.    Dispense:  28 tablet    Refill:  0      Orders Placed This Encounter  Procedures   BLADDER SCAN AMB NON-IMAGING     Return in about 3 months (around 09/21/2021) for PVR.    CC: Dr. Stoney Bang.      Irine Seal 06/21/2021 [phone]

## 2021-06-21 NOTE — Progress Notes (Signed)
post void residual=65m  Urological Symptom Review  Patient is experiencing the following symptoms: Get up at night to urinate Stream starts and stops Trouble starting stream Have to strain to urinate Weak stream   Review of Systems  Gastrointestinal (upper)  : Negative for upper GI symptoms  Gastrointestinal (lower) : Constipation  Constitutional : Weight loss Fatigue  Skin: Negative for skin symptoms  Eyes: Blurred vision  Ear/Nose/Throat : Negative for Ear/Nose/Throat symptoms  Hematologic/Lymphatic: Negative for Hematologic/Lymphatic symptoms  Cardiovascular : Negative for cardiovascular symptoms  Respiratory : Negative for respiratory symptoms  Endocrine: Negative for endocrine symptoms  Musculoskeletal: Back pain Joint pain  Neurological: Dizziness  Psychologic: Negative for psychiatric symptoms

## 2021-07-04 DIAGNOSIS — G2581 Restless legs syndrome: Secondary | ICD-10-CM | POA: Diagnosis not present

## 2021-07-04 DIAGNOSIS — J449 Chronic obstructive pulmonary disease, unspecified: Secondary | ICD-10-CM | POA: Diagnosis not present

## 2021-07-04 DIAGNOSIS — G20 Parkinson's disease: Secondary | ICD-10-CM | POA: Diagnosis not present

## 2021-07-04 DIAGNOSIS — G903 Multi-system degeneration of the autonomic nervous system: Secondary | ICD-10-CM | POA: Diagnosis not present

## 2021-07-04 DIAGNOSIS — N400 Enlarged prostate without lower urinary tract symptoms: Secondary | ICD-10-CM | POA: Diagnosis not present

## 2021-07-04 DIAGNOSIS — F0390 Unspecified dementia without behavioral disturbance: Secondary | ICD-10-CM | POA: Diagnosis not present

## 2021-07-09 DIAGNOSIS — G20 Parkinson's disease: Secondary | ICD-10-CM | POA: Diagnosis not present

## 2021-07-11 ENCOUNTER — Other Ambulatory Visit: Payer: Self-pay

## 2021-07-11 DIAGNOSIS — R3915 Urgency of urination: Secondary | ICD-10-CM

## 2021-07-11 DIAGNOSIS — N401 Enlarged prostate with lower urinary tract symptoms: Secondary | ICD-10-CM

## 2021-07-11 MED ORDER — MIRABEGRON ER 50 MG PO TB24: 50.0000 mg | ORAL_TABLET | Freq: Every day | ORAL | 3 refills | Status: DC

## 2021-08-17 DIAGNOSIS — N400 Enlarged prostate without lower urinary tract symptoms: Secondary | ICD-10-CM | POA: Diagnosis not present

## 2021-08-17 DIAGNOSIS — I959 Hypotension, unspecified: Secondary | ICD-10-CM | POA: Diagnosis not present

## 2021-08-17 DIAGNOSIS — G20 Parkinson's disease: Secondary | ICD-10-CM | POA: Diagnosis not present

## 2021-08-17 DIAGNOSIS — J449 Chronic obstructive pulmonary disease, unspecified: Secondary | ICD-10-CM | POA: Diagnosis not present

## 2021-08-17 DIAGNOSIS — I700 Atherosclerosis of aorta: Secondary | ICD-10-CM | POA: Diagnosis not present

## 2021-09-01 DIAGNOSIS — W19XXXA Unspecified fall, initial encounter: Secondary | ICD-10-CM | POA: Diagnosis not present

## 2021-09-01 DIAGNOSIS — S0101XA Laceration without foreign body of scalp, initial encounter: Secondary | ICD-10-CM | POA: Diagnosis not present

## 2021-09-25 DIAGNOSIS — G40909 Epilepsy, unspecified, not intractable, without status epilepticus: Secondary | ICD-10-CM | POA: Diagnosis not present

## 2021-09-25 DIAGNOSIS — I700 Atherosclerosis of aorta: Secondary | ICD-10-CM | POA: Diagnosis not present

## 2021-09-25 DIAGNOSIS — Z0000 Encounter for general adult medical examination without abnormal findings: Secondary | ICD-10-CM | POA: Diagnosis not present

## 2021-09-25 DIAGNOSIS — N400 Enlarged prostate without lower urinary tract symptoms: Secondary | ICD-10-CM | POA: Diagnosis not present

## 2021-09-25 DIAGNOSIS — G20 Parkinson's disease: Secondary | ICD-10-CM | POA: Diagnosis not present

## 2021-09-25 DIAGNOSIS — J449 Chronic obstructive pulmonary disease, unspecified: Secondary | ICD-10-CM | POA: Diagnosis not present

## 2021-09-25 DIAGNOSIS — I959 Hypotension, unspecified: Secondary | ICD-10-CM | POA: Diagnosis not present

## 2021-09-25 DIAGNOSIS — Z6823 Body mass index (BMI) 23.0-23.9, adult: Secondary | ICD-10-CM | POA: Diagnosis not present

## 2021-09-27 ENCOUNTER — Ambulatory Visit: Payer: Medicare PPO | Admitting: Urology

## 2021-10-01 DIAGNOSIS — G40909 Epilepsy, unspecified, not intractable, without status epilepticus: Secondary | ICD-10-CM | POA: Diagnosis not present

## 2021-10-03 DIAGNOSIS — N400 Enlarged prostate without lower urinary tract symptoms: Secondary | ICD-10-CM | POA: Diagnosis not present

## 2021-10-03 DIAGNOSIS — G2581 Restless legs syndrome: Secondary | ICD-10-CM | POA: Diagnosis not present

## 2021-10-03 DIAGNOSIS — J449 Chronic obstructive pulmonary disease, unspecified: Secondary | ICD-10-CM | POA: Diagnosis not present

## 2021-10-03 DIAGNOSIS — G20 Parkinson's disease: Secondary | ICD-10-CM | POA: Diagnosis not present

## 2021-10-03 DIAGNOSIS — G903 Multi-system degeneration of the autonomic nervous system: Secondary | ICD-10-CM | POA: Diagnosis not present

## 2021-10-03 DIAGNOSIS — F0390 Unspecified dementia without behavioral disturbance: Secondary | ICD-10-CM | POA: Diagnosis not present

## 2021-10-31 DIAGNOSIS — G903 Multi-system degeneration of the autonomic nervous system: Secondary | ICD-10-CM | POA: Diagnosis not present

## 2021-10-31 DIAGNOSIS — G20 Parkinson's disease: Secondary | ICD-10-CM | POA: Diagnosis not present

## 2021-10-31 DIAGNOSIS — G2581 Restless legs syndrome: Secondary | ICD-10-CM | POA: Diagnosis not present

## 2021-10-31 DIAGNOSIS — F0390 Unspecified dementia without behavioral disturbance: Secondary | ICD-10-CM | POA: Diagnosis not present

## 2021-10-31 DIAGNOSIS — N400 Enlarged prostate without lower urinary tract symptoms: Secondary | ICD-10-CM | POA: Diagnosis not present

## 2021-10-31 DIAGNOSIS — J449 Chronic obstructive pulmonary disease, unspecified: Secondary | ICD-10-CM | POA: Diagnosis not present

## 2021-11-16 DIAGNOSIS — I700 Atherosclerosis of aorta: Secondary | ICD-10-CM | POA: Diagnosis not present

## 2021-11-16 DIAGNOSIS — I959 Hypotension, unspecified: Secondary | ICD-10-CM | POA: Diagnosis not present

## 2021-11-16 DIAGNOSIS — G40909 Epilepsy, unspecified, not intractable, without status epilepticus: Secondary | ICD-10-CM | POA: Diagnosis not present

## 2021-11-16 DIAGNOSIS — J449 Chronic obstructive pulmonary disease, unspecified: Secondary | ICD-10-CM | POA: Diagnosis not present

## 2021-11-16 DIAGNOSIS — N400 Enlarged prostate without lower urinary tract symptoms: Secondary | ICD-10-CM | POA: Diagnosis not present

## 2021-11-16 DIAGNOSIS — G20 Parkinson's disease: Secondary | ICD-10-CM | POA: Diagnosis not present

## 2021-11-21 DIAGNOSIS — F17210 Nicotine dependence, cigarettes, uncomplicated: Secondary | ICD-10-CM | POA: Diagnosis not present

## 2021-11-30 DIAGNOSIS — R3915 Urgency of urination: Secondary | ICD-10-CM | POA: Insufficient documentation

## 2021-11-30 DIAGNOSIS — R351 Nocturia: Secondary | ICD-10-CM | POA: Insufficient documentation

## 2021-11-30 DIAGNOSIS — R3912 Poor urinary stream: Secondary | ICD-10-CM | POA: Insufficient documentation

## 2021-12-06 ENCOUNTER — Ambulatory Visit (INDEPENDENT_AMBULATORY_CARE_PROVIDER_SITE_OTHER): Payer: Medicare Other | Admitting: Urology

## 2021-12-06 ENCOUNTER — Encounter: Payer: Self-pay | Admitting: Urology

## 2021-12-06 ENCOUNTER — Other Ambulatory Visit: Payer: Self-pay

## 2021-12-06 VITALS — BP 95/64 | HR 96

## 2021-12-06 DIAGNOSIS — R3912 Poor urinary stream: Secondary | ICD-10-CM

## 2021-12-06 DIAGNOSIS — R300 Dysuria: Secondary | ICD-10-CM

## 2021-12-06 DIAGNOSIS — R3915 Urgency of urination: Secondary | ICD-10-CM

## 2021-12-06 DIAGNOSIS — N401 Enlarged prostate with lower urinary tract symptoms: Secondary | ICD-10-CM

## 2021-12-06 DIAGNOSIS — I951 Orthostatic hypotension: Secondary | ICD-10-CM

## 2021-12-06 DIAGNOSIS — R3129 Other microscopic hematuria: Secondary | ICD-10-CM

## 2021-12-06 DIAGNOSIS — R351 Nocturia: Secondary | ICD-10-CM | POA: Diagnosis not present

## 2021-12-06 LAB — URINALYSIS, ROUTINE W REFLEX MICROSCOPIC
Bilirubin, UA: NEGATIVE
Glucose, UA: NEGATIVE
Nitrite, UA: NEGATIVE
Specific Gravity, UA: 1.025 (ref 1.005–1.030)
Urobilinogen, Ur: 4 mg/dL — ABNORMAL HIGH (ref 0.2–1.0)
pH, UA: 7 (ref 5.0–7.5)

## 2021-12-06 LAB — MICROSCOPIC EXAMINATION: Renal Epithel, UA: NONE SEEN /hpf

## 2021-12-06 LAB — BLADDER SCAN AMB NON-IMAGING: Scan Result: 7

## 2021-12-06 MED ORDER — MIRABEGRON ER 50 MG PO TB24: 50.0000 mg | ORAL_TABLET | Freq: Every day | ORAL | 11 refills | Status: AC

## 2021-12-06 MED ORDER — FINASTERIDE 5 MG PO TABS: 5.0000 mg | ORAL_TABLET | Freq: Every day | ORAL | 3 refills | Status: AC

## 2021-12-06 NOTE — Progress Notes (Signed)
Subjective: 1. Benign localized prostatic hyperplasia with lower urinary tract symptoms (LUTS)   2. Urgency of urination   3. Weak urinary stream   4. Nocturia   5. Orthostatic hypotension   6. Microhematuria   7. Dysuria    12/06/21: Kawon returns today in f/u.  He is out of Myrbetriq.   He remains on finasteride but I don't see alfuzosin on his list.  He has been having issues with hypotension and that is probably why it has been stopped.  He has seen ads about Urolift and is curious if that would help him.  His IPSS remains elevated at 30.  He has pyuria and microhematuria with many bacteria on UA today.  He has dysuria but no gross hematuria.   06/21/21: Vladislav returns today to assess his response to the addition of Myrbetriq to the alfuzosin and finasteride.  He hasn't noticed much benefit and his IPSS is 34.  His PVR is 23ml.  He was unable to void today.  He is on a new med for Parkinson's, Rasagiline.   05/17/21: Gerasimos returns today in f/u.  His voiding symptoms have gotten worse on the afluzosin and finasteride and he has nocturia x 2.  He continues to have some UUI.  His IPSS is 33.  His culture was negative on 4/21.    03/08/21: Mr. Laraia is a 71 yo male with Parkinson's disease who is sent for evaluation of lower urinary tract symptoms.  He has been on finasteride and alfuzosin about 2-3 weeks ago which has helped but he has a slow stream and a sensation of incomplete emptying.  He has some urgency.   He has some UUI.  He has no SUI.  He has some intermittency and post void dribbling.  He has some dysuria.  His IPSS is 26/6 and his PVR is 88ml.  His UA today has 6-10 WBC's and 3-10 RBC's.  He is not had any UTI's.  He has not had GU surgery.  He reports chronic microhematuria.  He reports a normal PSA.    IPSS     Row Name 12/06/21 1100         International Prostate Symptom Score   How often have you had the sensation of not emptying your bladder? Almost always     How often  have you had to urinate less than every two hours? More than half the time     How often have you found you stopped and started again several times when you urinated? More than half the time     How often have you found it difficult to postpone urination? More than half the time     How often have you had a weak urinary stream? Almost always     How often have you had to strain to start urination? Almost always     How many times did you typically get up at night to urinate? 3 Times     Total IPSS Score 30       Quality of Life due to urinary symptoms   If you were to spend the rest of your life with your urinary condition just the way it is now how would you feel about that? Unhappy                ROS:  ROS  Allergies  Allergen Reactions   Latex Other (See Comments)    Pt. States " makes my skin burn"   Gabapentin Other (  See Comments)    Violent, memory loss, loss of appetite.    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   LIVER BIOPSY     Dr. Arlana Pouch at [facility] over 10 yrs ago.   PAROTIDECTOMY Left 05/25/2015   PAROTIDECTOMY Left 05/25/2015   Procedure: LEFT PAROTIDECTOMY;  Surgeon: Izora Gala, MD;   Location: [facility] OR;  Service: ENT;  Laterality: Left;   TONSILLECTOMY      Social History   Socioeconomic History   Marital status: Married    Spouse name: Not on file   Number of children: 2   Years of education: 12   Highest education level: High school graduate  Occupational History   Occupation: Retired  Tobacco Use   Smoking status: Every Day    Packs/day: 0.50    Years: 30.00    Pack years: 15.00    Types: Cigarettes   Smokeless tobacco: Never  Substance and Sexual Activity   Alcohol use: No    Comment: quit   Drug use: Yes    Comment: drugs in past.  Cocaine. 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 2 tatoos on rt lower leg none since age 49.   Sexual activity: Not on file  Other Topics Concern   Not on file  Social History Narrative   Lives at home with wife,   Right-handed.   3 cups caffeine per day.   Social Determinants of Health   Financial Resource Strain: Not on file  Food Insecurity: Not on file  Transportation Needs: Not on file  Physical Activity: Not on file  Stress: Not on file  Social Connections: Not on file  Intimate Partner Violence: Not on file    Family History  Problem Relation Age of Onset   Colon cancer Mother    Other Father        unsure of history    Anti-infectives: Anti-infectives (From admission, onward)    None       Current Outpatient Medications  Medication Sig Dispense Refill   albuterol (PROVENTIL HFA;VENTOLIN HFA) 108 (90 BASE) MCG/ACT inhaler Inhale into the lungs every 6 (six) hours as needed for wheezing or shortness of breath.     ALPRAZolam (XANAX) 1 MG tablet Take 1 mg by mouth 2 (two) times daily.      carbidopa-levodopa (SINEMET IR) 25-100 MG tablet TAKE 2 TABLETS FOUR TIMES DAILY 720 tablet 4   divalproex (DEPAKOTE ER) 500 MG 24 hr tablet Take by mouth.  donepezil (ARICEPT) 10 MG tablet TAKE 1 TABLET EVERY DAY AT BEDTIME 90 tablet 1   midodrine (PROAMATINE) 5 MG tablet Take 5 mg by mouth 2 (two) times daily.      pregabalin (LYRICA) 150 MG capsule Take 2 capsules (300 mg total) by mouth at bedtime. 60 capsule 0   rasagiline (AZILECT) 0.5 MG TABS tablet Take 0.5 mg by mouth daily as needed.     rasagiline (AZILECT) 1 MG TABS tablet Take 1 mg by mouth daily.     carbidopa-levodopa-entacapone (STALEVO) 31.25-125-200 MG tablet Take 2 tablets by mouth 2 (two) times daily. (Patient not taking: Reported on 12/06/2021)     finasteride (PROSCAR) 5 MG tablet Take 1 tablet (5 mg total) by mouth daily. 90 tablet 3   mirabegron ER (MYRBETRIQ) 50 MG TB24 tablet Take 1 tablet (50 mg total) by mouth daily. 30 tablet 11   rOPINIRole (REQUIP) 3 MG tablet Take by mouth. (Patient not taking: Reported on 12/06/2021)     No current facility-administered medications for this visit.     Objective: Vital signs in last 24 hours: BP 95/64    Pulse 96   Intake/Output from previous day: No intake/output data recorded. Intake/Output this shift: @IOTHISSHIFT @   Physical Exam  Lab Results:  Results for orders placed or performed in visit on 12/06/21 (from the past 24 hour(s))  Urinalysis, Routine w reflex microscopic     Status: Abnormal   Collection Time: 12/06/21 11:18 AM  Result Value Ref Range   Specific Gravity, UA 1.025 1.005 - 1.030   pH, UA 7.0 5.0 - 7.5   Color, UA Amber (A) Yellow   Appearance Ur Clear Clear   Leukocytes,UA Trace (A) Negative   Protein,UA 2+ (A) Negative/Trace   Glucose, UA Negative Negative   Ketones, UA Trace (A) Negative   RBC, UA 1+ (A) Negative   Bilirubin, UA Negative Negative   Urobilinogen, Ur 4.0 (H) 0.2 - 1.0 mg/dL   Nitrite, UA Negative Negative   Microscopic Examination See below:    Narrative   Performed at:  [facility] [address], [place], [place]  [id number] Lab Director: Mina Marble MT, Phone:  [phone]  Microscopic Examination     Status: Abnormal   Collection Time: 12/06/21 11:18 AM   Urine  Result Value Ref Range   WBC, UA 6-10 (A) 0 - 5  /hpf   RBC 3-10 (A) 0 - 2 /hpf   Epithelial Cells (non renal) 0-10 0 - 10 /hpf   Renal Epithel, UA None seen None seen /hpf   Mucus, UA Present Not Estab.   Bacteria, UA Many (A) None seen/Few   Narrative   Performed at:  [facility] [address], [place], [place]  [id number] Lab Director: [place], Phone:  [phone]     BMET No results for input(s): NA, K, CL, CO2, GLUCOSE, BUN, CREATININE, CALCIUM in the last 72 hours. PT/INR No results for input(s): LABPROT, INR in the last 72 hours. ABG No results for input(s): PHART, HCO3 in the last 72 hours.  Invalid input(s): PCO2, PO2  Studies/Results:  Renal US on 06/14/21 showed small bilateral renal cysts but no other abnormalities.    Assessment/Plan: BPH with BOO with OAB.  His symptoms remains severe on finasteride.  He has gotten some improvement with Myrbetriq 50mg .    Previous cystoscopy demonstrated trilobar hyperplasia with obstruction but the middle lobe is small.   We have discussed minimally invasive  therapy and TURP and will get him set up for a prostate [place] for volume assessment.   I don't think he would be a good candidate for an office procedure but could have a Urolift in the OR if his volume is appropriate.  The middle lobe could be tacked as well.   Microhematuria.  He has some pyuria and bacteriuria today.  I will get a culture.  The microhematuria is long standing.   Parkinson's disease.   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    Meds ordered this encounter  Medications   mirabegron ER (MYRBETRIQ) 50 MG TB24 tablet    Sig: Take 1 tablet (50 mg total) by mouth daily.    Dispense:  30 tablet    Refill:  11   finasteride (PROSCAR) 5 MG tablet    Sig: Take 1 tablet (5 mg total) by mouth daily.    Dispense:  90 tablet    Refill:  3      Orders Placed This Encounter  Procedures   Urine Culture   Microscopic Examination   [place] Transrectal Complete     Looking for prostate volume and anatomy.    Standing Status:   Future    Standing Expiration Date:   12/06/2022    Order Specific Question:   Reason for Exam (SYMPTOM  OR DIAGNOSIS REQUIRED)    Answer:   BPH with obstruction    Order Specific Question:   Preferred imaging location?    Answer:   [facility]   Urinalysis, Routine w reflex microscopic   BLADDER SCAN AMB NON-IMAGING     Return for Next available f/u with results of prostate [place]. .    CC: Dr. Stoney Bang.      Irine Seal 12/06/2021 [id number] ID: Dawson Bills, male   DOB: [birth year], 71 y.o.   MRN: [id number]

## 2021-12-06 NOTE — Progress Notes (Signed)
post void residual= 7 Urological Symptom Review  Patient is experiencing the following symptoms: Frequent urination Hard to postpone urination Burning/pain with urination Get up at night to urinate Leakage of urine Stream starts and stops Trouble starting stream Have to strain to urinate Painful intercourse Weak stream Penile pain (male only)    Review of Systems  Gastrointestinal (upper)  : Negative for upper GI symptoms  Gastrointestinal (lower) : Constipation  Constitutional : Weight loss Fatigue  Skin: Negative for skin symptoms  Eyes: Blurred vision  Ear/Nose/Throat : Negative for Ear/Nose/Throat symptoms  Hematologic/Lymphatic: Easy bruising  Cardiovascular : Negative for cardiovascular symptoms  Respiratory : Cough Shortness of breath  Endocrine: Negative for endocrine symptoms  Musculoskeletal: Back pain Joint pain  Neurological: Dizziness  Psychologic: Depression Anxiety

## 2021-12-08 LAB — URINE CULTURE

## 2021-12-10 ENCOUNTER — Telehealth: Payer: Self-pay

## 2021-12-10 NOTE — Telephone Encounter (Signed)
-----   Message from Irine Seal, MD sent at 12/10/2021 11:20 AM EST ----- Negative culture ----- Message ----- From: Dorisann Frames, RN Sent: 12/10/2021  10:04 AM EST To: Irine Seal, MD  Please review

## 2021-12-10 NOTE — Telephone Encounter (Signed)
Patient called and notified.

## 2021-12-12 ENCOUNTER — Ambulatory Visit (HOSPITAL_COMMUNITY): Payer: Medicare Other

## 2021-12-17 ENCOUNTER — Encounter: Payer: Self-pay | Admitting: Cardiothoracic Surgery

## 2021-12-18 DIAGNOSIS — I700 Atherosclerosis of aorta: Secondary | ICD-10-CM | POA: Diagnosis not present

## 2021-12-18 DIAGNOSIS — I2510 Atherosclerotic heart disease of native coronary artery without angina pectoris: Secondary | ICD-10-CM | POA: Diagnosis not present

## 2021-12-18 DIAGNOSIS — G20 Parkinson's disease: Secondary | ICD-10-CM | POA: Diagnosis not present

## 2021-12-18 DIAGNOSIS — J449 Chronic obstructive pulmonary disease, unspecified: Secondary | ICD-10-CM | POA: Diagnosis not present

## 2021-12-26 DIAGNOSIS — G20 Parkinson's disease: Secondary | ICD-10-CM | POA: Diagnosis not present

## 2021-12-26 DIAGNOSIS — I7121 Aneurysm of the ascending aorta, without rupture: Secondary | ICD-10-CM | POA: Diagnosis not present

## 2021-12-26 DIAGNOSIS — J449 Chronic obstructive pulmonary disease, unspecified: Secondary | ICD-10-CM | POA: Diagnosis not present

## 2021-12-26 DIAGNOSIS — I959 Hypotension, unspecified: Secondary | ICD-10-CM | POA: Diagnosis not present

## 2021-12-28 ENCOUNTER — Encounter (INDEPENDENT_AMBULATORY_CARE_PROVIDER_SITE_OTHER): Payer: Self-pay | Admitting: *Deleted

## 2022-01-23 DIAGNOSIS — G20 Parkinson's disease: Secondary | ICD-10-CM | POA: Diagnosis not present

## 2022-01-23 DIAGNOSIS — G903 Multi-system degeneration of the autonomic nervous system: Secondary | ICD-10-CM | POA: Diagnosis not present

## 2022-01-23 DIAGNOSIS — J449 Chronic obstructive pulmonary disease, unspecified: Secondary | ICD-10-CM | POA: Diagnosis not present

## 2022-01-23 DIAGNOSIS — G2581 Restless legs syndrome: Secondary | ICD-10-CM | POA: Diagnosis not present

## 2022-03-27 DIAGNOSIS — I7121 Aneurysm of the ascending aorta, without rupture: Secondary | ICD-10-CM | POA: Diagnosis not present

## 2022-03-27 DIAGNOSIS — I959 Hypotension, unspecified: Secondary | ICD-10-CM | POA: Diagnosis not present

## 2022-03-27 DIAGNOSIS — I209 Angina pectoris, unspecified: Secondary | ICD-10-CM | POA: Diagnosis not present

## 2022-03-27 DIAGNOSIS — I2510 Atherosclerotic heart disease of native coronary artery without angina pectoris: Secondary | ICD-10-CM | POA: Diagnosis not present

## 2022-04-17 DIAGNOSIS — Z79899 Other long term (current) drug therapy: Secondary | ICD-10-CM | POA: Diagnosis not present

## 2022-04-17 DIAGNOSIS — R296 Repeated falls: Secondary | ICD-10-CM | POA: Diagnosis not present

## 2022-04-17 DIAGNOSIS — G20 Parkinson's disease: Secondary | ICD-10-CM | POA: Diagnosis not present

## 2022-04-17 DIAGNOSIS — I951 Orthostatic hypotension: Secondary | ICD-10-CM | POA: Diagnosis not present

## 2022-04-22 DIAGNOSIS — I7121 Aneurysm of the ascending aorta, without rupture: Secondary | ICD-10-CM | POA: Diagnosis not present

## 2022-04-22 DIAGNOSIS — E782 Mixed hyperlipidemia: Secondary | ICD-10-CM | POA: Diagnosis not present

## 2022-04-22 DIAGNOSIS — I25118 Atherosclerotic heart disease of native coronary artery with other forms of angina pectoris: Secondary | ICD-10-CM | POA: Diagnosis not present

## 2022-04-22 DIAGNOSIS — I951 Orthostatic hypotension: Secondary | ICD-10-CM | POA: Diagnosis not present

## 2022-04-22 NOTE — Progress Notes (Signed)
 Assessment/Plan:    1. Coronary artery disease with stable angina pectoris - Currently not no BB/CCB/nitrates due to significant orthostatic hypotension - Will get Lexiscan MPI stress test as well as echocardiogram - Recommend to go to ED via EMS for refractory pain/pressure  2. 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 Orthostatic hypotension - At least partially due to his prostate medications +/- diagnosis of Parkinson's - Continue florinef and midodrine  - Reduce or changes prostate medications if possible; patient is contemplating prostate procedure as well  4. Mixed hyperlipidemia - Panel reviewed and satisfactory - Continue Crestor 20 mg at bedtime  5. Tobacco abuse disorder - Counseling to quit - Will defer to PCP  As above.  Await test results.  Return in 6 months or sooner if needed  Subjective:    Patient ID: Michael Fitzgerald is a 71 y.o.male.  Pleasant 71 year old gentleman evaluated at request of his primary care physician in regards to management of coronary artery disease and evaluation of chest discomfort.  He has past medical history of orthostatic hypotension, hyperlipidemia, atherosclerotic coronary artery disease, thoracic aortic aneurysm measuring 4.8 cm, Parkinson's, BPH, longstanding COPD and ongoing daily cigarette smoking.  He has been experiencing significant orthostatic hypotension since starting second medication for BPH, for this reason he has been started on Florinef and midodrine .  His orthostatics are positive in the office today with approximately 30 mmHg drop in systolic blood pressure going from laying position to standing position.  He reports chronic dizziness and previous syncope has not recurred since starting Florinef and midodrine .  He also endorses chest discomfort which he  describes as an ache on the left side of his chest, this is despite having low activity level.  He also endorses dyspnea on exertion which is longstanding and chronic for him.  His symptoms resolved with rest approximately 5 to 10 minutes.  Currently he is not following with cardio thoracic surgery or vascular for management of his aortic aneurysm.  The following portions of the patient's history were reviewed and updated as appropriate: allergies, current medications, past family history, past medical history, past social history, past surgical history, and problem list.  Review of Systems  Constitutional:  Positive for fatigue.  HENT: Negative.    Eyes: Negative.   Respiratory:  Positive for chest tightness.   Cardiovascular:  Positive for chest pain.  Gastrointestinal: Negative.   Endocrine: Negative.   Genitourinary: Negative.   Musculoskeletal: Negative.   Skin: Negative.   Allergic/Immunologic: Negative.   Neurological:  Positive for dizziness, syncope and light-headedness.  Hematological: Negative.   Psychiatric/Behavioral: Negative.        Objective:     Vitals:   04/22/22 1251  BP: 100/58  Pulse: 74  Resp: 20  Temp: 36.4 C (97.6 F)  SpO2: 97%   Wt Readings from Last 4 Encounters:  04/22/22 68.9 kg (151 lb 14.4 oz)  11/21/21 68 kg (150 lb)  09/01/21 71.7 kg (158 lb)  12/23/18 81.8 kg (180 lb 4.7 oz)   Physical Exam Vitals reviewed.  Constitutional:      Appearance: Normal appearance.  HENT:     Head: Normocephalic and atraumatic.     Mouth/Throat:     Mouth: Mucous membranes are moist.     Comments: Poor dentition/hygiene Eyes:     Extraocular Movements: Extraocular movements intact.  Pupils: Pupils are equal, round, and reactive to light.  Neck:     Vascular: No carotid bruit.  Cardiovascular:     Rate and Rhythm: Normal rate and regular rhythm.     Pulses: Normal pulses.     Heart sounds: Normal heart sounds. No murmur heard. Pulmonary:      Effort: Pulmonary effort is normal.     Breath sounds: Normal breath sounds.  Abdominal:     General: Abdomen is flat. Bowel sounds are normal.     Palpations: Abdomen is soft.  Musculoskeletal:     Cervical back: Neck supple.     Right lower leg: No edema.     Left lower leg: No edema.  Skin:    General: Skin is warm and dry.  Neurological:     General: No focal deficit present.     Mental Status: He is alert and oriented to person, place, and time.  Psychiatric:        Mood and Affect: Mood normal.        Behavior: Behavior normal.      Medications:   Current Outpatient Medications  Medication Instructions  . albuterol  HFA 90 mcg/actuation inhaler albuterol  sulfate HFA 90 mcg/actuation aerosol inhaler  inhale every 6 hrs  . ALPRAZolam  (XANAX ) 1 mg, Oral, 3 times a day PRN  . aspirin-acetaminophen -caffeine (EXCEDRIN MIGRAINE) 250-250-65 mg per tablet 1 tablet, Oral, Every 6 hours PRN  . carbidopa -levodopa  (SINEMET ) 25-100 mg per tablet 1 tablet, Oral, Every 4 hours  . finasteride  (PROSCAR ) 5 mg, Oral, Daily (standard)  . fludrocortisone (FLORINEF) 0.1 mg, Oral, Daily (standard)  . midodrine  (PROAMATINE ) 10 mg, Oral, 3 times a day (standard)  . rosuvastatin (CRESTOR) 20 mg, Oral, Daily (standard)  . silodosin (RAPAFLO) 8 mg, Oral, Daily    Procedures:   ECG:  NSR, normal ECG  Holter/Event:  None  Echo: None  Coronary CT:  Non-dedicated CT chest showed 3 vessel calcium  Stress test:  None since 2014 (report not available)  CMR:  None  Device check:  N/A  The ASCVD Risk score (Arnett DK, et al., 2019) failed to calculate.   Follow up:   Return in about 6 months (around 10/22/2022).  Soheil Assar DO [facility] Division of Cardiology  [employer] [facility]

## 2022-05-10 DIAGNOSIS — I2510 Atherosclerotic heart disease of native coronary artery without angina pectoris: Secondary | ICD-10-CM | POA: Diagnosis not present

## 2022-05-10 DIAGNOSIS — I25118 Atherosclerotic heart disease of native coronary artery with other forms of angina pectoris: Secondary | ICD-10-CM | POA: Diagnosis not present

## 2022-05-10 DIAGNOSIS — I7121 Aneurysm of the ascending aorta, without rupture: Secondary | ICD-10-CM | POA: Diagnosis not present

## 2022-05-10 DIAGNOSIS — R079 Chest pain, unspecified: Secondary | ICD-10-CM | POA: Diagnosis not present

## 2022-05-10 DIAGNOSIS — I951 Orthostatic hypotension: Secondary | ICD-10-CM | POA: Diagnosis not present

## 2022-05-10 DIAGNOSIS — R0609 Other forms of dyspnea: Secondary | ICD-10-CM | POA: Diagnosis not present

## 2022-06-19 ENCOUNTER — Encounter (INDEPENDENT_AMBULATORY_CARE_PROVIDER_SITE_OTHER): Payer: Self-pay | Admitting: *Deleted

## 2022-06-26 DIAGNOSIS — I700 Atherosclerosis of aorta: Secondary | ICD-10-CM | POA: Diagnosis not present

## 2022-06-26 DIAGNOSIS — J449 Chronic obstructive pulmonary disease, unspecified: Secondary | ICD-10-CM | POA: Diagnosis not present

## 2022-06-26 DIAGNOSIS — N39498 Other specified urinary incontinence: Secondary | ICD-10-CM | POA: Diagnosis not present

## 2022-06-26 DIAGNOSIS — G40909 Epilepsy, unspecified, not intractable, without status epilepticus: Secondary | ICD-10-CM | POA: Diagnosis not present

## 2022-07-10 DIAGNOSIS — Z79899 Other long term (current) drug therapy: Secondary | ICD-10-CM | POA: Diagnosis not present

## 2022-07-10 DIAGNOSIS — R296 Repeated falls: Secondary | ICD-10-CM | POA: Diagnosis not present

## 2022-07-10 DIAGNOSIS — G20 Parkinson's disease: Secondary | ICD-10-CM | POA: Diagnosis not present

## 2022-07-10 DIAGNOSIS — I951 Orthostatic hypotension: Secondary | ICD-10-CM | POA: Diagnosis not present

## 2022-07-28 DIAGNOSIS — K6289 Other specified diseases of anus and rectum: Secondary | ICD-10-CM | POA: Diagnosis not present

## 2022-07-28 DIAGNOSIS — R109 Unspecified abdominal pain: Secondary | ICD-10-CM | POA: Diagnosis not present

## 2022-07-28 DIAGNOSIS — F17210 Nicotine dependence, cigarettes, uncomplicated: Secondary | ICD-10-CM | POA: Diagnosis not present

## 2022-07-28 DIAGNOSIS — G20 Parkinson's disease: Secondary | ICD-10-CM | POA: Diagnosis not present

## 2022-07-28 DIAGNOSIS — K5900 Constipation, unspecified: Secondary | ICD-10-CM | POA: Diagnosis not present

## 2022-07-28 DIAGNOSIS — K5641 Fecal impaction: Secondary | ICD-10-CM | POA: Diagnosis not present

## 2022-09-24 DIAGNOSIS — I2510 Atherosclerotic heart disease of native coronary artery without angina pectoris: Secondary | ICD-10-CM | POA: Diagnosis not present

## 2022-09-24 DIAGNOSIS — N39498 Other specified urinary incontinence: Secondary | ICD-10-CM | POA: Diagnosis not present

## 2022-09-24 DIAGNOSIS — J449 Chronic obstructive pulmonary disease, unspecified: Secondary | ICD-10-CM | POA: Diagnosis not present

## 2022-09-24 DIAGNOSIS — I700 Atherosclerosis of aorta: Secondary | ICD-10-CM | POA: Diagnosis not present

## 2022-09-24 DIAGNOSIS — I7121 Aneurysm of the ascending aorta, without rupture: Secondary | ICD-10-CM | POA: Diagnosis not present

## 2022-09-24 DIAGNOSIS — G40909 Epilepsy, unspecified, not intractable, without status epilepticus: Secondary | ICD-10-CM | POA: Diagnosis not present

## 2022-09-24 DIAGNOSIS — Z125 Encounter for screening for malignant neoplasm of prostate: Secondary | ICD-10-CM | POA: Diagnosis not present

## 2022-09-26 DIAGNOSIS — Z79899 Other long term (current) drug therapy: Secondary | ICD-10-CM | POA: Diagnosis not present

## 2022-09-26 DIAGNOSIS — G20B2 Parkinson's disease with dyskinesia, with fluctuations: Secondary | ICD-10-CM | POA: Diagnosis not present

## 2022-09-26 DIAGNOSIS — I951 Orthostatic hypotension: Secondary | ICD-10-CM | POA: Diagnosis not present

## 2022-09-26 DIAGNOSIS — R296 Repeated falls: Secondary | ICD-10-CM | POA: Diagnosis not present

## 2022-10-04 DIAGNOSIS — G20A1 Parkinson's disease without dyskinesia, without mention of fluctuations: Secondary | ICD-10-CM | POA: Diagnosis not present

## 2022-10-04 DIAGNOSIS — Z91040 Latex allergy status: Secondary | ICD-10-CM | POA: Diagnosis not present

## 2022-10-04 DIAGNOSIS — Z9109 Other allergy status, other than to drugs and biological substances: Secondary | ICD-10-CM | POA: Diagnosis not present

## 2022-10-21 DIAGNOSIS — H524 Presbyopia: Secondary | ICD-10-CM | POA: Diagnosis not present

## 2022-10-22 DIAGNOSIS — E782 Mixed hyperlipidemia: Secondary | ICD-10-CM | POA: Diagnosis not present

## 2022-10-22 DIAGNOSIS — I951 Orthostatic hypotension: Secondary | ICD-10-CM | POA: Diagnosis not present

## 2022-10-22 DIAGNOSIS — I25118 Atherosclerotic heart disease of native coronary artery with other forms of angina pectoris: Secondary | ICD-10-CM | POA: Diagnosis not present

## 2022-11-01 DIAGNOSIS — H2513 Age-related nuclear cataract, bilateral: Secondary | ICD-10-CM | POA: Diagnosis not present

## 2022-11-04 DIAGNOSIS — H2513 Age-related nuclear cataract, bilateral: Secondary | ICD-10-CM | POA: Diagnosis not present

## 2022-11-25 DIAGNOSIS — S0181XA Laceration without foreign body of other part of head, initial encounter: Secondary | ICD-10-CM | POA: Diagnosis not present

## 2022-11-25 DIAGNOSIS — Z888 Allergy status to other drugs, medicaments and biological substances status: Secondary | ICD-10-CM | POA: Diagnosis not present

## 2022-11-25 DIAGNOSIS — R55 Syncope and collapse: Secondary | ICD-10-CM | POA: Diagnosis not present

## 2022-11-25 DIAGNOSIS — R0602 Shortness of breath: Secondary | ICD-10-CM | POA: Diagnosis not present

## 2022-11-25 DIAGNOSIS — S01112A Laceration without foreign body of left eyelid and periocular area, initial encounter: Secondary | ICD-10-CM | POA: Diagnosis not present

## 2022-11-25 DIAGNOSIS — Z91040 Latex allergy status: Secondary | ICD-10-CM | POA: Diagnosis not present

## 2022-11-25 DIAGNOSIS — G20B1 Parkinson's disease with dyskinesia, without mention of fluctuations: Secondary | ICD-10-CM | POA: Diagnosis not present

## 2022-11-25 DIAGNOSIS — W228XXA Striking against or struck by other objects, initial encounter: Secondary | ICD-10-CM | POA: Diagnosis not present

## 2022-11-25 DIAGNOSIS — F17210 Nicotine dependence, cigarettes, uncomplicated: Secondary | ICD-10-CM | POA: Diagnosis not present

## 2022-11-25 DIAGNOSIS — G20B2 Parkinson's disease with dyskinesia, with fluctuations: Secondary | ICD-10-CM | POA: Diagnosis not present

## 2022-12-25 DIAGNOSIS — N39498 Other specified urinary incontinence: Secondary | ICD-10-CM | POA: Diagnosis not present

## 2022-12-25 DIAGNOSIS — I700 Atherosclerosis of aorta: Secondary | ICD-10-CM | POA: Diagnosis not present

## 2022-12-25 DIAGNOSIS — G40909 Epilepsy, unspecified, not intractable, without status epilepticus: Secondary | ICD-10-CM | POA: Diagnosis not present

## 2022-12-25 DIAGNOSIS — J449 Chronic obstructive pulmonary disease, unspecified: Secondary | ICD-10-CM | POA: Diagnosis not present

## 2023-01-15 DIAGNOSIS — H2511 Age-related nuclear cataract, right eye: Secondary | ICD-10-CM | POA: Diagnosis not present

## 2023-01-15 DIAGNOSIS — H269 Unspecified cataract: Secondary | ICD-10-CM | POA: Diagnosis not present

## 2023-02-19 DIAGNOSIS — H2512 Age-related nuclear cataract, left eye: Secondary | ICD-10-CM | POA: Diagnosis not present

## 2023-02-19 DIAGNOSIS — H269 Unspecified cataract: Secondary | ICD-10-CM | POA: Diagnosis not present

## 2023-03-07 DIAGNOSIS — R9082 White matter disease, unspecified: Secondary | ICD-10-CM | POA: Diagnosis not present

## 2023-03-07 DIAGNOSIS — S6991XA Unspecified injury of right wrist, hand and finger(s), initial encounter: Secondary | ICD-10-CM | POA: Diagnosis not present

## 2023-03-07 DIAGNOSIS — S0990XA Unspecified injury of head, initial encounter: Secondary | ICD-10-CM | POA: Diagnosis not present

## 2023-03-07 DIAGNOSIS — M7989 Other specified soft tissue disorders: Secondary | ICD-10-CM | POA: Diagnosis not present

## 2023-03-07 DIAGNOSIS — F17210 Nicotine dependence, cigarettes, uncomplicated: Secondary | ICD-10-CM | POA: Diagnosis not present

## 2023-03-07 DIAGNOSIS — W1830XA Fall on same level, unspecified, initial encounter: Secondary | ICD-10-CM | POA: Diagnosis not present

## 2023-03-07 DIAGNOSIS — W228XXA Striking against or struck by other objects, initial encounter: Secondary | ICD-10-CM | POA: Diagnosis not present

## 2023-03-07 DIAGNOSIS — S63511A Sprain of carpal joint of right wrist, initial encounter: Secondary | ICD-10-CM | POA: Diagnosis not present

## 2023-03-07 DIAGNOSIS — M25531 Pain in right wrist: Secondary | ICD-10-CM | POA: Diagnosis not present

## 2023-03-07 DIAGNOSIS — W19XXXA Unspecified fall, initial encounter: Secondary | ICD-10-CM | POA: Diagnosis not present

## 2023-03-07 DIAGNOSIS — S60211A Contusion of right wrist, initial encounter: Secondary | ICD-10-CM | POA: Diagnosis not present

## 2023-03-07 DIAGNOSIS — S6391XA Sprain of unspecified part of right wrist and hand, initial encounter: Secondary | ICD-10-CM | POA: Diagnosis not present

## 2023-03-18 DIAGNOSIS — N39498 Other specified urinary incontinence: Secondary | ICD-10-CM | POA: Diagnosis not present

## 2023-03-18 DIAGNOSIS — G40909 Epilepsy, unspecified, not intractable, without status epilepticus: Secondary | ICD-10-CM | POA: Diagnosis not present

## 2023-03-18 DIAGNOSIS — J449 Chronic obstructive pulmonary disease, unspecified: Secondary | ICD-10-CM | POA: Diagnosis not present

## 2023-03-18 DIAGNOSIS — I700 Atherosclerosis of aorta: Secondary | ICD-10-CM | POA: Diagnosis not present

## 2023-03-24 DIAGNOSIS — Z0000 Encounter for general adult medical examination without abnormal findings: Secondary | ICD-10-CM | POA: Diagnosis not present

## 2023-03-24 DIAGNOSIS — N39498 Other specified urinary incontinence: Secondary | ICD-10-CM | POA: Diagnosis not present

## 2023-03-24 DIAGNOSIS — I7121 Aneurysm of the ascending aorta, without rupture: Secondary | ICD-10-CM | POA: Diagnosis not present

## 2023-03-24 DIAGNOSIS — I2510 Atherosclerotic heart disease of native coronary artery without angina pectoris: Secondary | ICD-10-CM | POA: Diagnosis not present

## 2023-03-24 DIAGNOSIS — G40909 Epilepsy, unspecified, not intractable, without status epilepticus: Secondary | ICD-10-CM | POA: Diagnosis not present

## 2023-03-24 DIAGNOSIS — I700 Atherosclerosis of aorta: Secondary | ICD-10-CM | POA: Diagnosis not present

## 2023-03-24 DIAGNOSIS — J449 Chronic obstructive pulmonary disease, unspecified: Secondary | ICD-10-CM | POA: Diagnosis not present

## 2023-04-01 DIAGNOSIS — E2740 Unspecified adrenocortical insufficiency: Secondary | ICD-10-CM | POA: Diagnosis not present

## 2023-04-18 DIAGNOSIS — J449 Chronic obstructive pulmonary disease, unspecified: Secondary | ICD-10-CM | POA: Diagnosis not present

## 2023-04-18 DIAGNOSIS — G40909 Epilepsy, unspecified, not intractable, without status epilepticus: Secondary | ICD-10-CM | POA: Diagnosis not present

## 2023-04-18 DIAGNOSIS — N39498 Other specified urinary incontinence: Secondary | ICD-10-CM | POA: Diagnosis not present

## 2023-04-18 DIAGNOSIS — I700 Atherosclerosis of aorta: Secondary | ICD-10-CM | POA: Diagnosis not present

## 2023-04-23 DIAGNOSIS — E782 Mixed hyperlipidemia: Secondary | ICD-10-CM | POA: Diagnosis not present

## 2023-04-23 DIAGNOSIS — I25118 Atherosclerotic heart disease of native coronary artery with other forms of angina pectoris: Secondary | ICD-10-CM | POA: Diagnosis not present

## 2023-04-23 DIAGNOSIS — I951 Orthostatic hypotension: Secondary | ICD-10-CM | POA: Diagnosis not present

## 2023-05-05 DIAGNOSIS — G40909 Epilepsy, unspecified, not intractable, without status epilepticus: Secondary | ICD-10-CM | POA: Diagnosis not present

## 2023-05-05 DIAGNOSIS — J449 Chronic obstructive pulmonary disease, unspecified: Secondary | ICD-10-CM | POA: Diagnosis not present

## 2023-05-05 DIAGNOSIS — N39498 Other specified urinary incontinence: Secondary | ICD-10-CM | POA: Diagnosis not present

## 2023-05-05 DIAGNOSIS — I700 Atherosclerosis of aorta: Secondary | ICD-10-CM | POA: Diagnosis not present

## 2023-05-05 DIAGNOSIS — Z0000 Encounter for general adult medical examination without abnormal findings: Secondary | ICD-10-CM | POA: Diagnosis not present

## 2023-05-07 ENCOUNTER — Other Ambulatory Visit: Payer: Self-pay

## 2023-05-08 ENCOUNTER — Encounter: Payer: Self-pay | Admitting: Internal Medicine

## 2023-05-08 ENCOUNTER — Telehealth: Payer: Self-pay | Admitting: Internal Medicine

## 2023-05-08 ENCOUNTER — Ambulatory Visit: Payer: Medicare Other | Attending: Internal Medicine | Admitting: Internal Medicine

## 2023-05-08 VITALS — BP 132/84 | HR 77 | Ht 70.0 in | Wt 147.2 lb

## 2023-05-08 DIAGNOSIS — Z720 Tobacco use: Secondary | ICD-10-CM | POA: Diagnosis not present

## 2023-05-08 DIAGNOSIS — I7121 Aneurysm of the ascending aorta, without rupture: Secondary | ICD-10-CM | POA: Diagnosis not present

## 2023-05-08 DIAGNOSIS — I951 Orthostatic hypotension: Secondary | ICD-10-CM | POA: Insufficient documentation

## 2023-05-08 DIAGNOSIS — I719 Aortic aneurysm of unspecified site, without rupture: Secondary | ICD-10-CM

## 2023-05-08 MED ORDER — MIDODRINE HCL 5 MG PO TABS: 7.5000 mg | ORAL_TABLET | Freq: Three times a day (TID) | ORAL | 2 refills | Status: AC

## 2023-05-08 NOTE — Telephone Encounter (Signed)
Checking percert on the following patient for testing scheduled at [facility].   CT Angio Chest Aorta 05/30/2023

## 2023-05-08 NOTE — Progress Notes (Signed)
Cardiology Office Note  Date: 05/08/2023   ID: Michael Fitzgerald, DOB Feb 05, 1951, MRN [id number]  PCP:  Toma Deiters, MD  Cardiologist:  None Electrophysiologist:  None   Reason for Office Visit: Evaluation of ascending thoracic aortic aneurysm   History of Present Illness: Michael Fitzgerald is a 72 y.o. male known to have ascending thoracic aortic aneurysm 4.8 cm in 11/23, nicotine abuse, Parkinson disease with orthostatic hypotension was referred to cardiology clinic for management of ascending thoracic aortic aneurysm.  Patient has advanced Parkinson disease c/w orthostatic hypotension. He was seeing a neurologist previously and switched to PCP after his neurologist retired.  Currently on fludrocortisone and midodrine 5 mg TID.  Still continues to have dizziness and falls when he stands up.  Denies any angina, DOE, palpitations.  However he complained of chest pains twice in the last 1 month.  Currently smokes 1 pack/day.  Past Medical History:  Diagnosis Date   Anxiety 03/25/2011   Arthritis    Blood in the urine    hx -"hole in lft kidney" no problems generally no renal dr in yrs   Complication of anesthesia    "wild when wakes up"   COPD (chronic obstructive pulmonary disease) (HCC)    "slight"   Depression    Erectile dysfunction 03/25/2011   GERD (gastroesophageal reflux disease)    rolaids occ   Hepatitis C 03/25/2011   Manic depressive disease manic phase (HCC)    won't take med   Night terrors    Parkinsonism    RLS (restless legs syndrome)    Sleep apnea    no cpap   Sleep walking     Past Surgical History:  Procedure Laterality Date   COLONOSCOPY  03/25/2011,16   DEEP SHOULDER TUMOR EXCISION     2002 wrapped 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   LIVER BIOPSY     Dr. Gwenevere Abbot at [facility] over 10 yrs ago.   PAROTIDECTOMY Left 05/25/2015   PAROTIDECTOMY Left 05/25/2015   Procedure: LEFT PAROTIDECTOMY;  Surgeon: Serena Colonel, MD;  Location: [facility] OR;  Service: ENT;   Laterality: Left;   TONSILLECTOMY      Current Outpatient Medications  Medication Sig Dispense Refill   albuterol (PROVENTIL HFA;VENTOLIN HFA) 108 (90 BASE) MCG/ACT inhaler Inhale into the lungs every 6 (six) hours as needed for wheezing or shortness of breath.     ALPRAZolam (XANAX) 1 MG tablet Take 1 mg by mouth 2 (two) times daily.      carbidopa-levodopa (SINEMET IR) 25-100 MG tablet TAKE 2 TABLETS FOUR TIMES DAILY 720 tablet 4   carbidopa-levodopa-entacapone (STALEVO) 31.25-125-200 MG tablet Take 2 tablets by mouth 2 (two) times daily.     divalproex (DEPAKOTE ER) 500 MG 24 hr tablet Take by mouth.     donepezil (ARICEPT) 10 MG tablet TAKE 1 TABLET EVERY DAY AT BEDTIME 90 tablet 1   finasteride (PROSCAR) 5 MG tablet Take 1 tablet (5 mg total) by mouth daily. 90 tablet 3   fludrocortisone (FLORINEF) 0.1 MG tablet Take 0.1 mg by mouth daily.     midodrine (PROAMATINE) 5 MG tablet Take 1.5 tablets (7.5 mg total) by mouth 3 (three) times daily with meals. 135 tablet 2   mirabegron ER (MYRBETRIQ) 50 MG TB24 tablet Take 1 tablet (50 mg total) by mouth daily. 30 tablet 11   pregabalin (LYRICA) 150 MG capsule Take 2 capsules (300 mg total) by mouth at bedtime. 60 capsule 0   rasagiline (AZILECT) 0.5 MG  TABS tablet Take 0.5 mg by mouth daily as needed.     rasagiline (AZILECT) 1 MG TABS tablet Take 1 mg by mouth daily.     rOPINIRole (REQUIP) 3 MG tablet Take by mouth.     rosuvastatin (CRESTOR) 20 MG tablet Take 20 mg by mouth daily.     No current facility-administered medications for this visit.   Allergies:  Latex, Gabapentin, Prednisone, and Sildenafil citrate   Social History: The patient  reports that he has been smoking cigarettes. He has a 15.00 pack-year smoking history. He has never used smokeless tobacco. He reports current drug use. He reports that he does not drink alcohol.   Family History: The patient's family history includes Colon cancer in his mother; Other in his father.    ROS:  Please see the history of present illness. Otherwise, complete review of systems is positive for none  All other systems are reviewed and negative.   Physical Exam: VS:  BP 132/84   Pulse 77   Ht 5\' 10"  (1.778 m)   Wt 147 lb 3.2 oz (66.8 kg)   SpO2 96%   BMI 21.12 kg/m , BMI Body mass index is 21.12 kg/m.  Wt Readings from Last 3 Encounters:  05/08/23 147 lb 3.2 oz (66.8 kg)  05/17/21 158 lb (71.7 kg)  03/08/21 165 lb (74.8 kg)    General: Patient appears comfortable at rest. HEENT: Conjunctiva and lids normal, oropharynx clear with moist mucosa. Neck: Supple, no elevated JVP or carotid bruits, no thyromegaly. Lungs: Clear to auscultation, nonlabored breathing at rest. Cardiac: Regular rate and rhythm, no S3 or significant systolic murmur, no pericardial rub. Abdomen: Soft, nontender, no hepatomegaly, bowel sounds present, no guarding or rebound. Extremities: No pitting edema, distal pulses 2+. Skin: Warm and dry. Musculoskeletal: No kyphosis. Neuropsychiatric: Alert and oriented x3, affect grossly appropriate.  Recent Labwork: No results found for requested labs within last 365 days.  No results found for: "CHOL", "TRIG", "HDL", "CHOLHDL", "VLDL", "LDLCALC", "LDLDIRECT"   Assessment and Plan:  # Ascending thoracic aortic aneurysm 4.8 cm in 09/2022 -Obtain CTA aorta -Cardiothoracic surgery referral -BP goal <130/80 mmHg -Smoking cessation -Continue high intensity statin, rosuvastatin 20 mg nightly -Avoid strenuous exercise or heavy weightlifting  # Orthostatic hypotension likely secondary to advanced Parkinson disease -Patient continues to have dizziness with standing (check at his prior neurologist office in the past) and had falls.  Can increase midodrine from 5 mg to 7.5 mg TID.  Wear compression socks.  Follows up with PCP for management of Parkinson.  # Nicotine abuse -Currently smokes 1 pack/day.  Smoking cessation counseling provided. Smoking cessation  instruction/counseling given:  counseled patient on the dangers of tobacco use, advised patient to stop smoking, and reviewed strategies to maximize success    I have spent a total of 45 minutes with patient reviewing chart, EKGs, labs and examining patient as well as establishing an assessment and plan that was discussed with the patient.  > 50% of time was spent in direct patient care.    Medication Adjustments/Labs and Tests Ordered: Current medicines are reviewed at length with the patient today.  Concerns regarding medicines are outlined above.   Tests Ordered: Orders Placed This Encounter  Procedures   CT ANGIO CHEST AORTA W/CM & OR WO/CM   Ambulatory referral to Cardiothoracic Surgery   EKG 12-Lead    Medication Changes: Meds ordered this encounter  Medications   midodrine (PROAMATINE) 5 MG tablet    Sig: Take 1.5  tablets (7.5 mg total) by mouth 3 (three) times daily with meals.    Dispense:  135 tablet    Refill:  2    05/08/2023-Dose Increase    Disposition:  Follow up in 1 year(s)  Signed Holli Rengel Verne Spurr, MD, 05/08/2023 12:15 PM    [facility] [address] [place], [place], [place] [address]

## 2023-05-08 NOTE — Patient Instructions (Addendum)
Medication Instructions:  Your physician has recommended you make the following change in your medication:  Increase Midodrine to 7.5 mg three times a day Continue taking all other medications as prescribed  Labwork: None  Testing/Procedures: Non-Cardiac CT Angiography (CTA), is a special type of CT scan that uses a computer to produce multi-dimensional views of major blood vessels throughout the body. In CT angiography, a contrast material is injected through an IV to help visualize the blood vessels   Follow-Up: Your physician recommends that you schedule a follow-up appointment in: 1 year You will receive a reminder call in the mail in about 10 months reminding you to call and schedule your appointment. If you don't receive this call, please contact our office.   Any Other Special Instructions Will Be Listed Below (If Applicable).  If you need a refill on your cardiac medications before your next appointment, please call your pharmacy.

## 2023-05-13 ENCOUNTER — Encounter (INDEPENDENT_AMBULATORY_CARE_PROVIDER_SITE_OTHER): Payer: Self-pay | Admitting: *Deleted

## 2023-05-16 DIAGNOSIS — I951 Orthostatic hypotension: Secondary | ICD-10-CM | POA: Diagnosis not present

## 2023-05-16 DIAGNOSIS — R55 Syncope and collapse: Secondary | ICD-10-CM | POA: Diagnosis not present

## 2023-05-16 DIAGNOSIS — I499 Cardiac arrhythmia, unspecified: Secondary | ICD-10-CM | POA: Diagnosis not present

## 2023-05-16 DIAGNOSIS — Z91040 Latex allergy status: Secondary | ICD-10-CM | POA: Diagnosis not present

## 2023-05-16 DIAGNOSIS — I2510 Atherosclerotic heart disease of native coronary artery without angina pectoris: Secondary | ICD-10-CM | POA: Diagnosis not present

## 2023-05-16 DIAGNOSIS — F17210 Nicotine dependence, cigarettes, uncomplicated: Secondary | ICD-10-CM | POA: Diagnosis not present

## 2023-05-16 DIAGNOSIS — I25119 Atherosclerotic heart disease of native coronary artery with unspecified angina pectoris: Secondary | ICD-10-CM | POA: Diagnosis not present

## 2023-05-16 DIAGNOSIS — Z79899 Other long term (current) drug therapy: Secondary | ICD-10-CM | POA: Diagnosis not present

## 2023-05-16 DIAGNOSIS — T17920A Food in respiratory tract, part unspecified causing asphyxiation, initial encounter: Secondary | ICD-10-CM | POA: Diagnosis not present

## 2023-05-16 DIAGNOSIS — E782 Mixed hyperlipidemia: Secondary | ICD-10-CM | POA: Diagnosis not present

## 2023-05-16 DIAGNOSIS — I21A1 Myocardial infarction type 2: Secondary | ICD-10-CM | POA: Diagnosis not present

## 2023-05-16 DIAGNOSIS — N400 Enlarged prostate without lower urinary tract symptoms: Secondary | ICD-10-CM | POA: Diagnosis not present

## 2023-05-16 DIAGNOSIS — Z888 Allergy status to other drugs, medicaments and biological substances status: Secondary | ICD-10-CM | POA: Diagnosis not present

## 2023-05-16 DIAGNOSIS — Z7952 Long term (current) use of systemic steroids: Secondary | ICD-10-CM | POA: Diagnosis not present

## 2023-05-16 DIAGNOSIS — I7121 Aneurysm of the ascending aorta, without rupture: Secondary | ICD-10-CM | POA: Diagnosis not present

## 2023-05-16 DIAGNOSIS — I4891 Unspecified atrial fibrillation: Secondary | ICD-10-CM | POA: Diagnosis not present

## 2023-05-16 DIAGNOSIS — G20A1 Parkinson's disease without dyskinesia, without mention of fluctuations: Secondary | ICD-10-CM | POA: Diagnosis not present

## 2023-05-16 DIAGNOSIS — D649 Anemia, unspecified: Secondary | ICD-10-CM | POA: Diagnosis not present

## 2023-05-16 DIAGNOSIS — I700 Atherosclerosis of aorta: Secondary | ICD-10-CM | POA: Diagnosis not present

## 2023-05-16 DIAGNOSIS — I712 Thoracic aortic aneurysm, without rupture, unspecified: Secondary | ICD-10-CM | POA: Diagnosis not present

## 2023-05-16 DIAGNOSIS — J9811 Atelectasis: Secondary | ICD-10-CM | POA: Diagnosis not present

## 2023-05-16 DIAGNOSIS — F419 Anxiety disorder, unspecified: Secondary | ICD-10-CM | POA: Diagnosis not present

## 2023-05-16 DIAGNOSIS — E785 Hyperlipidemia, unspecified: Secondary | ICD-10-CM | POA: Diagnosis not present

## 2023-05-16 NOTE — H&P (Signed)
 ------------------------------------------------------------------------------- Attestation signed by Feliz Margart Fallow, DO at 05/17/23 1724 I have seen, examined, and chart reviewed for  Michael Fitzgerald and supervised the nurse practitioner/PA in the care of the patient.  Relevant conversation regarding plan of care has been done.  I have discussed with the APP and agree with the assessment and plan.     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 *    Plan:  A-fib with RVR Amiodarone drip in place Heart rate is responding, down to the 110s Patient is symptomatically improved Placed in ICU for close monitoring while on the drip Patient has outpatient cardiologist he can follow-up with at discharge Eliquis initiated for anticoagulation  2.  Orthostatic hypotension Continue midodrine , Florinef Monitor blood pressure closely especially with standing Soft at times today but stable  3.  Thoracic aortic aneurysm Has outpatient follow-up with CT surgery on July 12 Stable on CT exam today  4.  BPH Continue Flomax Monitor urine output  5.  Parkinson's disease Patient is extremely tremorous, has not had carbidopa -levodopa  on schedule today Restarted medication every 3 hours PT and OT if the patient is still here Monday Wife states he refuses to use a  walker  6.  CAD Heart healthy diet Check lipids tomorrow  7.  Tobacco abuse Patient is actively trying to cut back, smoking 1/2 pack/day Counseled Nicotine patch available if needed  8.  Anxiety Continue Xanax  at bedtime and midday if needed Patient is calm and cooperative upon exam  9.  Anemia Hemoglobin 10.2, platelets 111 Will monitor closely in light of starting Eliquis No active bleeding  Anticoagulation; Eliquis IV fluids; normal saline at 125/h Drips; amiodarone CODE STATUS; full code  Admit patient to ICU for management of A-fib RVR on IV amiodarone.  Discussed the admission with the patient and his wife at the bedside as well as with Dr. Feliz who agrees with the plan.  Patient will need at least 2 midnights inpatient status to manage this new problem in light of comorbidities.  **Inpatient Necessity Form:  The severity of illness is impacted directly by the following complex Medical Factors:   Complex Medical Factors: Age related physical debility and Parkinson's disease  Known risk factors include:  Known Risk Factors: Age: 72 y.o., Stairs, and Lives Alone  The following co-morbidities are present and will impact the need for inpatient care:  CoMorbities:  Patient Active Problem List  Diagnosis  . 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 Orthostatic hypotension  . Mixed hyperlipidemia  . Tobacco abuse disorder  . Tobacco abuse counseling  . Benign prostatic hyperplasia with urinary frequency  . Thoracic aortic aneurysm (CMS-HCC)  . Parkinson's disease (CMS-HCC)  . Atrial fibrillation with RVR (CMS-HCC)  . Hypomagnesemia  . Anemia    All of this impacts the intensity of service required to give patient adequate care.**  Consults: None initially  Chief Complaint: Chest Pain (  Choked on food )   HPI: Michael Fitzgerald is a 72 y.o. male with PMHx as reviewed in the EMR that presented to  [facility] and is being admitted for Atrial fibrillation with RVR (CMS-HCC).  Patient presents to the ED today after a choking episode on a piece of chicken, his wife attempted the Heimlich maneuver and he almost lost consciousness but was able to dislodge the piece of food and cough it up.  His chest hurt following this episode and he came to the ED.  Was found to be in sinus tachycardia, later thought to be atrial fibrillation with RVR.  Started on amiodarone and heart rate came down from 150s to 110s.  Chest pain resolved upon exam.  States he is feeling much better.  Past medical history includes Parkinson's disease, anxiety, BPH, orthostatic hypotension, hyperlipidemia, tobacco abuse, abdominal aortic aneurysm, seizures.  PCP is Dr. Orpha.  Scheduled to see cardiothoracic surgery at [facility] on July 12 for AAA.  Sees cardiologist Mallipeddi, last office visit 6/20.  Sees neurologist Thenganatt, last visit 11/23.  Sees urologist Dr. Watt, 1/23 last office visit.  Patient would like to be a full code.  His wife is at the bedside today.  He lives at home alone, his wife lives next-door because they get along better living apart.  Findings today significant for hemoglobin 10.2, magnesium 1.5, thyroid  normal 1.615, CT scan shows ascending thoracic aortic aneurysm measuring 4.6 cm in diameter.  Chest x-ray shows no abnormal findings.  Heart rate has been as high as the 150s with respiratory rate up to 24, blood pressure soft down to 72/56.  Latex, Gabapentin, Prednisone, Pregabalin , and Sildenafil citrate Prior to Admission medications   Medication Dose, Route, Frequency  albuterol  HFA 90 mcg/actuation inhaler albuterol  sulfate HFA 90 mcg/actuation aerosol inhaler  inhale every 6 hrs  alfuzosin  (UROXATRAL ) 10 mg 24 hr tablet 10 mg, Oral, After dinner  ALPRAZolam  (XANAX ) 1 MG tablet 1 mg, Oral, 3 times a day PRN  carbidopa -levodopa  (SINEMET ) 25-100 mg per tablet 1 tablet, Oral, Every 3 hours   divalproex ER (DEPAKOTE ER) 500 MG extended released 24 hr tablet 500 mg, Oral, 2 times a day  finasteride  (PROSCAR ) 5 mg tablet 5 mg, Oral, Daily (standard)  fludrocortisone (FLORINEF) 0.1 mg tablet 0.1 mg, Oral, 2 times a day (standard)  midodrine  (PROAMATINE ) 10 MG tablet 10 mg, Oral, 3 times a day (standard)  rosuvastatin (CRESTOR) 20 MG tablet 20 mg, Daily (standard) Patient not taking: Reported on 04/23/2023   Past Medical History:  Diagnosis Date  . AAA (abdominal aortic aneurysm) (CMS-HCC)   . Anxiety   . Arthritis   . Depression   . Parkinson disease (CMS-HCC)   . Seizures (CMS-HCC)    Past Surgical History:  Procedure Laterality Date  . CATARACT EXTRACTION Bilateral 02/2023   No family history on file. Social History   Socioeconomic History  . Marital status: Married  Tobacco Use  . Smoking status: Every Day    Current packs/day: 0.50    Average packs/day: 1 pack/day for 50.8 years (50.4 ttl pk-yrs)    Types: Cigarettes    Start date: 07/19/2022  . Smokeless tobacco: Never  Vaping Use  . Vaping status: Never Used  Substance and Sexual Activity  . Alcohol  use: Not Currently  . Drug use: Not Currently    Review of Systems  Cardiovascular:  Positive for chest pain (resolved).  Neurological:  Positive for tremors (Parkinson's, baseline).  Labs/Studies All lab results last 24 hours:   Recent Results (from the past 24 hour(s))  Arterial Blood Gas   Collection Time: 05/16/23  2:55 PM  Result Value Ref Range   pH, Arterial 7.44 7.35 - 7.45   pCO2, Arterial 31.8 (L) 35.0 - 48.0 mm[Hg]   pO2, Arterial 81 (L) 83 - 108 mm[Hg]   HCO3 (Bicarbonate), Arterial 21.4 18.0 - 23.0 mmol/L   Base Excess, Arterial -2.1 (L) -2.0 - 3.0 mmol/L   O2 Sat, Arterial 97.2 95.0 - 98.0 %   Total Carbon Dioxide, Arterial 22 22 - 29 mmol/L   Carboxyhemoglobin 2.8 0.0 - 4.9 %   ABG Allen Test POS    BG Draw Site Radial, left   Comprehensive Metabolic Panel   Collection Time:  05/16/23  2:57 PM  Result Value Ref Range   Sodium 142 135 - 145 mmol/L   Potassium 4.2 3.5 - 5.0 mmol/L   Chloride 108 (H) 98 - 107 mmol/L   CO2 25.0 21.0 - 32.0 mmol/L   Anion Gap 9 3 - 11 mmol/L   BUN 22 (H) 8 - 20 mg/dL   Creatinine 8.85 9.19 - 1.30 mg/dL   BUN/Creatinine Ratio 19    eGFR CKD-EPI (2021) Male 69 >=60 mL/min/1.85m2   Glucose 104 70 - 179 mg/dL   Calcium 8.4 (L) 8.5 - 10.1 mg/dL   Albumin 3.2 (L) 3.5 - 5.0 g/dL   Total Protein 5.9 (L) 6.0 - 8.0 g/dL   Total Bilirubin 0.5 0.3 - 1.2 mg/dL   AST 13 (L) 15 - 40 U/L   ALT 7 (L) 12 - 78 U/L   Alkaline Phosphatase 88 46 - 116 U/L  Magnesium   Collection Time: 05/16/23  2:57 PM  Result Value Ref Range   Magnesium 1.5 (L) 1.6 - 2.6 mg/dL  Phosphorus   Collection Time: 05/16/23  2:57 PM  Result Value Ref Range   Phosphorus 3.7 2.4 - 5.1 mg/dL  PT-INR   Collection Time: 05/16/23  2:57 PM  Result Value Ref Range   PT 12.0 9.9 - 12.6 sec   INR 1.08 Undefined  PTT   Collection Time: 05/16/23  2:57 PM  Result Value Ref Range   APTT 28.9 24.8 - 38.4 sec  Lipase   Collection Time: 05/16/23  2:57 PM  Result Value Ref Range   Lipase 30 16 - 77 U/L  TSH   Collection Time: 05/16/23  2:57 PM  Result Value Ref Range   TSH 1.615 0.550 - 4.780 uIU/mL  CBC w/ Differential   Collection Time: 05/16/23  2:57 PM  Result Value Ref Range   WBC 5.5 4.0 - 10.5 10*9/L   RBC 3.22 (L) 4.10 - 5.60 10*12/L   HGB 10.2 (L) 12.5 - 17.0 g/dL   HCT 68.6 (L) 63.9 - 49.9 %   MCV 97.2 80.0 - 98.0 fL   MCH 31.7 27.0 - 34.0 pg   MCHC 32.6 32.0 - 36.0 g/dL   RDW 86.2 88.4 - 85.4 %   MPV 10.4 7.4 - 10.4 fL   Platelet 111 (L) 140 - 415 10*9/L   Neutrophils % 67.8 %   Lymphocytes % 21.1 %   Monocytes % 9.8 %   Eosinophils % 0.4 %   Basophils % 0.5 %   Absolute Neutrophils 3.7 1.8 - 7.8 10*9/L   Absolute Lymphocytes 1.2 0.7 - 4.5 10*9/L   Absolute Monocytes 0.5 0.1 - 1.0 10*9/L   Absolute Eosinophils 0.0 0.0 - 0.4  10*9/L   Absolute  Basophils 0.0 0.0 - 0.2 10*9/L  hsTroponin I (serial 0-2-6H w/ delta)   Collection Time: 05/16/23  3:07 PM  Result Value Ref Range   hsTroponin I 6 <=53 ng/L  ECG 12 Lead   Collection Time: 05/16/23  5:56 PM  Result Value Ref Range   EKG Systolic BP  mmHg   EKG Diastolic BP  mmHg   EKG Ventricular Rate 125 BPM   EKG Atrial Rate  BPM   EKG P-R Interval  ms   EKG QRS Duration 82 ms   EKG Q-T Interval 348 ms   EKG QTC Calculation 502 ms   EKG Calculated P Axis  degrees   EKG Calculated R Axis 53 degrees   EKG Calculated T Axis 7 degrees   QTC Fredericia 444 ms    Vitals:   05/16/23 1719  BP: 119/79  Pulse: 126  Resp: 23  Temp:   SpO2: 99%  , There is no height or weight on file to calculate BMI.  Physical Exam Vitals and nursing note reviewed.  Constitutional:      Appearance: Normal appearance.  HENT:     Head: Normocephalic.     Right Ear: External ear normal.     Left Ear: External ear normal.     Nose: Nose normal.     Mouth/Throat:     Mouth: Mucous membranes are moist.  Eyes:     Pupils: Pupils are equal, round, and reactive to light.  Cardiovascular:     Rate and Rhythm: Tachycardia present. Rhythm irregular.     Pulses: Normal pulses.     Heart sounds: Normal heart sounds.  Pulmonary:     Effort: Pulmonary effort is normal.     Breath sounds: Normal breath sounds.     Comments: 2 L Abdominal:     General: Bowel sounds are normal.     Palpations: Abdomen is soft.     Tenderness: There is no abdominal tenderness. There is no guarding.  Musculoskeletal:        General: Normal range of motion.     Cervical back: Neck supple.     Right lower leg: No edema.     Left lower leg: No edema.  Skin:    General: Skin is warm and dry.  Neurological:     General: No focal deficit present.     Mental Status: He is alert and oriented to person, place, and time.  Psychiatric:        Mood and Affect: Mood normal.      ECG 12 Lead  Result Date:  05/16/2023 Atrial fibrillation with rapid ventricular response Low voltage QRS Cannot rule out Anterior infarct , age undetermined Abnormal ECG When compared with ECG of 16-May-2023 15:03, Atrial fibrillation has replaced Sinus rhythm Minimal criteria for Anterior infarct are now present  CTA Chest W Contrast  Result Date: 05/16/2023 CLINICAL DATA:  Thoracic aortic aneurysm. Recent choking episode with decreased responsiveness.   EXAM: CT ANGIOGRAPHY CHEST WITH CONTRAST   TECHNIQUE: Multidetector CT imaging of the chest was performed using the standard protocol during bolus administration of intravenous contrast. Multiplanar CT image reconstructions and MIPs were obtained to evaluate the vascular anatomy.   RADIATION DOSE REDUCTION: This exam was performed according to the departmental dose-optimization program which includes automated exposure control, adjustment of the mA and/or kV according to patient size and/or use of iterative reconstruction technique.   CONTRAST:  100 mL Omnipaque 350 IV.  COMPARISON:  Report of previous chest CT 11/21/2021 as no images available.   FINDINGS: Cardiovascular: Heart is normal size. Calcified plaque over the left anterior descending and right coronary arteries. Evidence of ascending thoracic aortic aneurysm measuring 4.6 cm in AP diameter (4.8 cm on report from previous exam 11/21/2021). Aortic arch and descending thoracic aorta are normal in caliber. There is mild calcified plaque over the descending thoracic aorta. Pulmonary arterial system is normal. Remaining vascular structures are unremarkable.   Mediastinum/Nodes: No evidence of mediastinal or hilar adenopathy. Remaining mediastinal structures are unremarkable.   Lungs/Pleura: Lungs are adequately inflated without focal lobar consolidation or effusion. There is mild bibasilar dependent atelectasis. Airways are normal.   Upper Abdomen: Mild calcified plaque over the abdominal aorta which is normal in  caliber. Multiple small bilateral Bosniak type 1 renal cysts. No further imaging follow-up recommended. No acute findings.   Musculoskeletal: Degenerative changes of the spine. No focal abnormality.   Review of the MIP images confirms the above findings.     1. No acute cardiopulmonary disease. 2. Ascending thoracic aortic aneurysm measuring 4.6 cm in AP diameter (4.8 cm on report from previous exam 11/21/2021). Recommend semi-annual imaging followup by CTA or MRA and referral to cardiothoracic surgery if not already obtained. This recommendation follows 2010 ACCF/AHA/AATS/ACR/ASA/SCA/SCAI/SIR/STS/SVM Guidelines for the Diagnosis and Management of Patients With Thoracic Aortic Disease. Circulation. 2010; 121: [id number]. Aortic aneurysm NOS (ICD10-I71.9). 3. Aortic atherosclerosis. Coronary atherosclerosis. 4. Multiple small bilateral Bosniak type 1 renal cysts. No further imaging follow-up recommended.   Aortic Atherosclerosis (ICD10-I70.0).     Electronically Signed   By: Toribio Agreste M.D.   On: 05/16/2023 17:45     XR Chest Portable  Result Date: 05/16/2023 CLINICAL DATA:  CHEST PAIN Pt BIB EMS from home wife reports pt came in from outside holding throat was choking on piece of chicken. Pt decreased in responsiveness, wife performed heimlich maneuver and removed chicken.   EXAM: PORTABLE CHEST 1 VIEW   COMPARISON:  None Available.   FINDINGS: Cardiac paddles overlie the chest.   The heart and mediastinal contours are unchanged. Aortic calcification.   No focal consolidation. No pulmonary edema. No pleural effusion. No pneumothorax.   No acute osseous abnormality.     1. No active disease. 2.  Aortic Atherosclerosis (ICD10-I70.0).     Electronically Signed   By: Morgane  Naveau M.D.   On: 05/16/2023 15:05       DVT prophylaxis:  on other anticoagulant (apixaban, rivaroxaban, dabigatran, prasugrel) Anticipated disposition: To Home with Home Health Estimated  discharge:  1-2 days  This chart has been completed using Engineer, civil (consulting) software, and while attempts have been made to ensure accuracy, certain words and phrases may not be transcribed as intended  Brutus FORBES Shank, FNP

## 2023-05-24 DIAGNOSIS — G20A1 Parkinson's disease without dyskinesia, without mention of fluctuations: Secondary | ICD-10-CM | POA: Diagnosis not present

## 2023-05-24 DIAGNOSIS — Z5329 Procedure and treatment not carried out because of patient's decision for other reasons: Secondary | ICD-10-CM | POA: Diagnosis not present

## 2023-05-24 DIAGNOSIS — Z79899 Other long term (current) drug therapy: Secondary | ICD-10-CM | POA: Diagnosis not present

## 2023-05-24 DIAGNOSIS — R0689 Other abnormalities of breathing: Secondary | ICD-10-CM | POA: Diagnosis not present

## 2023-05-24 DIAGNOSIS — F17210 Nicotine dependence, cigarettes, uncomplicated: Secondary | ICD-10-CM | POA: Diagnosis not present

## 2023-05-24 DIAGNOSIS — Z91040 Latex allergy status: Secondary | ICD-10-CM | POA: Diagnosis not present

## 2023-05-24 DIAGNOSIS — Z888 Allergy status to other drugs, medicaments and biological substances status: Secondary | ICD-10-CM | POA: Diagnosis not present

## 2023-05-24 DIAGNOSIS — S0990XA Unspecified injury of head, initial encounter: Secondary | ICD-10-CM | POA: Diagnosis not present

## 2023-05-24 DIAGNOSIS — R55 Syncope and collapse: Secondary | ICD-10-CM | POA: Diagnosis not present

## 2023-05-24 DIAGNOSIS — S0101XA Laceration without foreign body of scalp, initial encounter: Secondary | ICD-10-CM | POA: Diagnosis not present

## 2023-05-29 ENCOUNTER — Telehealth: Payer: Self-pay | Admitting: Internal Medicine
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
## 2023-05-29 NOTE — Telephone Encounter (Signed)
Pt c/o medication issue:  1. Name of Medication: Amiodarone 400 mg  2. How are you currently taking this medication (dosage and times per day)?   3. Are you having a reaction (difficulty breathing--STAT)?   4. What is your medication issue? Wife says it is too strong,. She says the patient have been passing out - she wants to know if he needs to cut pill in half and just take 200 mg?

## 2023-05-30 ENCOUNTER — Ambulatory Visit (HOSPITAL_COMMUNITY): Payer: Medicare Other

## 2023-05-30 NOTE — Telephone Encounter (Signed)
Called patient to advise that Dr. Jenene Slicker stated that eh could hold off on Amiodarone, but she didn't prescribe it. Patient stated that he has an appt with PCP to follow up on other medications. Patient verbalized understanding

## 2023-06-02 DIAGNOSIS — I7121 Aneurysm of the ascending aorta, without rupture: Secondary | ICD-10-CM | POA: Diagnosis not present

## 2023-06-02 DIAGNOSIS — I2510 Atherosclerotic heart disease of native coronary artery without angina pectoris: Secondary | ICD-10-CM | POA: Diagnosis not present

## 2023-06-02 DIAGNOSIS — I480 Paroxysmal atrial fibrillation: Secondary | ICD-10-CM | POA: Diagnosis not present

## 2023-06-02 DIAGNOSIS — Z6822 Body mass index (BMI) 22.0-22.9, adult: Secondary | ICD-10-CM | POA: Diagnosis not present

## 2023-06-04 ENCOUNTER — Encounter: Payer: Self-pay | Admitting: Surgery

## 2023-06-23 DIAGNOSIS — J449 Chronic obstructive pulmonary disease, unspecified: Secondary | ICD-10-CM | POA: Diagnosis not present

## 2023-06-23 DIAGNOSIS — I10 Essential (primary) hypertension: Secondary | ICD-10-CM | POA: Diagnosis not present

## 2023-06-23 DIAGNOSIS — G40909 Epilepsy, unspecified, not intractable, without status epilepticus: Secondary | ICD-10-CM | POA: Diagnosis not present

## 2023-06-23 DIAGNOSIS — I700 Atherosclerosis of aorta: Secondary | ICD-10-CM | POA: Diagnosis not present

## 2023-06-25 DIAGNOSIS — E876 Hypokalemia: Secondary | ICD-10-CM | POA: Diagnosis not present

## 2023-06-25 DIAGNOSIS — R300 Dysuria: Secondary | ICD-10-CM | POA: Diagnosis not present

## 2023-06-25 DIAGNOSIS — R079 Chest pain, unspecified: Secondary | ICD-10-CM | POA: Diagnosis not present

## 2023-06-25 DIAGNOSIS — F13930 Sedative, hypnotic or anxiolytic use, unspecified with withdrawal, uncomplicated: Secondary | ICD-10-CM | POA: Diagnosis not present

## 2023-06-25 DIAGNOSIS — G20A1 Parkinson's disease without dyskinesia, without mention of fluctuations: Secondary | ICD-10-CM | POA: Diagnosis not present

## 2023-06-25 DIAGNOSIS — R339 Retention of urine, unspecified: Secondary | ICD-10-CM | POA: Diagnosis not present

## 2023-06-25 DIAGNOSIS — R252 Cramp and spasm: Secondary | ICD-10-CM | POA: Diagnosis not present

## 2023-06-25 DIAGNOSIS — G20C Parkinsonism, unspecified: Secondary | ICD-10-CM | POA: Diagnosis not present

## 2023-06-25 DIAGNOSIS — G249 Dystonia, unspecified: Secondary | ICD-10-CM | POA: Diagnosis not present

## 2023-08-07 DIAGNOSIS — I10 Essential (primary) hypertension: Secondary | ICD-10-CM | POA: Diagnosis not present

## 2023-08-07 DIAGNOSIS — Z6822 Body mass index (BMI) 22.0-22.9, adult: Secondary | ICD-10-CM | POA: Diagnosis not present

## 2023-08-07 DIAGNOSIS — G20A1 Parkinson's disease without dyskinesia, without mention of fluctuations: Secondary | ICD-10-CM | POA: Diagnosis not present

## 2023-08-23 DIAGNOSIS — G20A1 Parkinson's disease without dyskinesia, without mention of fluctuations: Secondary | ICD-10-CM | POA: Diagnosis not present

## 2023-08-23 DIAGNOSIS — K5903 Drug induced constipation: Secondary | ICD-10-CM | POA: Diagnosis not present

## 2023-08-23 DIAGNOSIS — R339 Retention of urine, unspecified: Secondary | ICD-10-CM | POA: Diagnosis not present

## 2023-08-23 DIAGNOSIS — K5641 Fecal impaction: Secondary | ICD-10-CM | POA: Diagnosis not present

## 2023-08-23 DIAGNOSIS — I714 Abdominal aortic aneurysm, without rupture, unspecified: Secondary | ICD-10-CM | POA: Diagnosis not present

## 2023-08-23 DIAGNOSIS — R109 Unspecified abdominal pain: Secondary | ICD-10-CM | POA: Diagnosis not present

## 2023-08-23 DIAGNOSIS — Z7901 Long term (current) use of anticoagulants: Secondary | ICD-10-CM | POA: Diagnosis not present

## 2023-08-23 DIAGNOSIS — K5900 Constipation, unspecified: Secondary | ICD-10-CM | POA: Diagnosis not present

## 2023-08-23 DIAGNOSIS — Z79899 Other long term (current) drug therapy: Secondary | ICD-10-CM | POA: Diagnosis not present

## 2023-08-23 DIAGNOSIS — F419 Anxiety disorder, unspecified: Secondary | ICD-10-CM | POA: Diagnosis not present

## 2023-08-23 DIAGNOSIS — R140 Abdominal distension (gaseous): Secondary | ICD-10-CM | POA: Diagnosis not present

## 2023-08-23 DIAGNOSIS — Z5329 Procedure and treatment not carried out because of patient's decision for other reasons: Secondary | ICD-10-CM | POA: Diagnosis not present

## 2023-08-23 DIAGNOSIS — F17210 Nicotine dependence, cigarettes, uncomplicated: Secondary | ICD-10-CM | POA: Diagnosis not present

## 2023-08-25 DIAGNOSIS — Z6820 Body mass index (BMI) 20.0-20.9, adult: Secondary | ICD-10-CM | POA: Diagnosis not present

## 2023-08-25 DIAGNOSIS — R339 Retention of urine, unspecified: Secondary | ICD-10-CM | POA: Diagnosis not present

## 2023-08-25 DIAGNOSIS — I10 Essential (primary) hypertension: Secondary | ICD-10-CM | POA: Diagnosis not present

## 2023-08-28 DIAGNOSIS — Z6820 Body mass index (BMI) 20.0-20.9, adult: Secondary | ICD-10-CM | POA: Diagnosis not present

## 2023-08-28 DIAGNOSIS — I10 Essential (primary) hypertension: Secondary | ICD-10-CM | POA: Diagnosis not present

## 2023-08-28 DIAGNOSIS — R339 Retention of urine, unspecified: Secondary | ICD-10-CM | POA: Diagnosis not present

## 2023-09-22 DIAGNOSIS — J449 Chronic obstructive pulmonary disease, unspecified: Secondary | ICD-10-CM | POA: Diagnosis not present

## 2023-09-22 DIAGNOSIS — G40909 Epilepsy, unspecified, not intractable, without status epilepticus: Secondary | ICD-10-CM | POA: Diagnosis not present

## 2023-09-22 DIAGNOSIS — I700 Atherosclerosis of aorta: Secondary | ICD-10-CM | POA: Diagnosis not present

## 2023-09-22 DIAGNOSIS — I10 Essential (primary) hypertension: Secondary | ICD-10-CM | POA: Diagnosis not present

## 2023-11-13 ENCOUNTER — Encounter (INDEPENDENT_AMBULATORY_CARE_PROVIDER_SITE_OTHER): Payer: Self-pay | Admitting: *Deleted

## 2023-12-02 DIAGNOSIS — Z79899 Other long term (current) drug therapy: Secondary | ICD-10-CM | POA: Diagnosis not present

## 2023-12-02 DIAGNOSIS — Z8679 Personal history of other diseases of the circulatory system: Secondary | ICD-10-CM | POA: Diagnosis not present

## 2023-12-02 DIAGNOSIS — Z7901 Long term (current) use of anticoagulants: Secondary | ICD-10-CM | POA: Diagnosis not present

## 2023-12-02 DIAGNOSIS — R1030 Lower abdominal pain, unspecified: Secondary | ICD-10-CM | POA: Diagnosis not present

## 2023-12-02 DIAGNOSIS — F17210 Nicotine dependence, cigarettes, uncomplicated: Secondary | ICD-10-CM | POA: Diagnosis not present

## 2023-12-02 DIAGNOSIS — Z9842 Cataract extraction status, left eye: Secondary | ICD-10-CM | POA: Diagnosis not present

## 2023-12-02 DIAGNOSIS — M1991 Primary osteoarthritis, unspecified site: Secondary | ICD-10-CM | POA: Diagnosis not present

## 2023-12-02 DIAGNOSIS — N3001 Acute cystitis with hematuria: Secondary | ICD-10-CM | POA: Diagnosis not present

## 2023-12-02 DIAGNOSIS — Z9841 Cataract extraction status, right eye: Secondary | ICD-10-CM | POA: Diagnosis not present

## 2023-12-02 DIAGNOSIS — R569 Unspecified convulsions: Secondary | ICD-10-CM | POA: Diagnosis not present

## 2023-12-02 DIAGNOSIS — K5900 Constipation, unspecified: Secondary | ICD-10-CM | POA: Diagnosis not present

## 2023-12-02 DIAGNOSIS — G20A1 Parkinson's disease without dyskinesia, without mention of fluctuations: Secondary | ICD-10-CM | POA: Diagnosis not present

## 2023-12-02 DIAGNOSIS — R339 Retention of urine, unspecified: Secondary | ICD-10-CM | POA: Diagnosis not present

## 2023-12-02 NOTE — ED Provider Notes (Signed)
 Emergency Department Provider Note    ED Clinical Impression   Final diagnoses:  Urinary retention (Primary)  Acute cystitis with hematuria    ED Assessment/Plan    Condition: Stable Disposition: Discharge  This chart has been completed using Dragon Medical Dictation software, and while attempts have been made to ensure accuracy, certain words and phrases may not be transcribed as intended.   History   Chief Complaint  Patient presents with  . Urinary Retention   HPI  Michael Fitzgerald is a 73 y.o. male who presents today to the emergency department complaining of not being able to urinate for the past 36 hours.  He is also having some lower abdominal pain.  He had been constipated, so he took a laxative and enema at home and had a couple of large bowel movements, but has still not been able to urinate.  Denies any fever, nausea or vomiting.  Allergies: is allergic to latex, gabapentin, prednisone, pregabalin , and sildenafil citrate. Medications: has a current medication list which includes the following long-term medication(s): albuterol , amiodarone, carbidopa -levodopa , and rosuvastatin. PMHx:  has a past medical history of AAA (abdominal aortic aneurysm) (CMS-HCC), Anxiety, Arthritis, Depression, Parkinson disease (CMS-HCC), and Seizures (CMS-HCC). PSHx:  has a past surgical history that includes Cataract extraction (Bilateral, 02/2023). SocHx:  reports that he has been smoking cigarettes. He started smoking about 16 months ago. He has a 50.7 pack-year smoking history. He has never used smokeless tobacco. He reports that he does not currently use alcohol . He reports that he does not currently use drugs. Allergies, Medications, Medical, Surgical, and Social History were reviewed as documented above.   Social Drivers of Health with Concerns   Internet Connectivity: Not on file  Tobacco Use:  High Risk (12/02/2023)   Patient History   . Smoking Tobacco Use: Every Day   . Smokeless Tobacco Use: Never   . Passive Exposure: Not on file  Physical Activity: Not on file  Stress: Not on file  Social Connections: Unknown (05/16/2023)   Social Connection and Isolation Panel [NHANES]   . Frequency of Communication with Friends and Family: More than three times a week   . Frequency of Social Gatherings with Friends and Family: More than three times a week   . Attends Religious Services: Not on file   . Active Member of Clubs or Organizations: Not on file   . Attends Banker Meetings: Not on file   . Marital Status: Married  Health Literacy: Medium Risk (05/17/2023)   Health Literacy   . : Sometimes     Review Of Systems  Review of Systems  Constitutional:  Negative for chills and fever.  Respiratory:  Negative for shortness of breath.   Cardiovascular:  Negative for chest pain.  Gastrointestinal:  Positive for constipation. Negative for nausea and vomiting.  Genitourinary:  Positive for difficulty urinating.  All other systems reviewed and are negative.   Physical Exam   BP 122/79   Pulse 84   Temp 36.6 C (97.9 F) (Oral)   Resp 17   Ht 170.2 cm (5' 7)   Wt 63.5 kg (140 lb)   SpO2 97%   BMI 21.93 kg/m   Physical Exam Vitals and nursing note reviewed.  Constitutional:      General: He is not  in acute distress.    Appearance: He is well-developed.  HENT:     Head: Normocephalic and atraumatic.  Cardiovascular:     Rate and Rhythm: Normal rate and regular rhythm.     Heart sounds: Normal heart sounds.  Pulmonary:     Effort: Pulmonary effort is normal.     Breath sounds: Normal breath sounds.  Abdominal:     General: Abdomen is flat. Bowel sounds are normal.     Palpations: Abdomen is soft.     Tenderness: There is abdominal tenderness in the suprapubic area.  Neurological:     Mental Status: He is alert and oriented to person, place, and time.   Psychiatric:        Mood and Affect: Mood normal.        Behavior: Behavior normal.     ED Course  Medical Decision Making Initial output from catheter was 750 mL.  UA indicates a UTI.  Will treat with Macrobid and advised Tylenol , increased hydration and to keep the catheter in place until removed by his urologist.  Follow-up with urologist.  Follow-up with PCP.  I have reviewed my clinical findings and my clinical impression with the patient. The patient has expressed understanding that at this time there is no evidence for a more serious underlying process, but also understands that early in the process of a condition such as this, an initial workup can be falsely reassuring. I have counseled and discussed follow-up with the patient, stressing the importance of appropriate follow-up. I have also counseled the patient to return if symptoms worsen or if there are any concerns. Routine discharge counseling was given to the patient and the patient understands that worsening, changing or persistent symptoms should prompt an immediate call or follow up with their primary physician or return to the emergency department for reevaluation. Patient has expressed understanding.  Amount and/or Complexity of Data Reviewed Labs: ordered. Decision-making details documented in ED Course.  Risk OTC drugs. Prescription drug management.     Procedures   No results found for this visit on 12/02/23 (from the past 4464 hours).   ED Results Results for orders placed or performed during the hospital encounter of 12/02/23  Urinalysis with Microscopy with Culture Reflex  Result Value Ref Range   Color, UA Light Yellow    Clarity, UA Clear Clear   Specific Gravity, UA 1.020 1.010 - 1.025   pH, UA 6.5 5.0 - 8.0   Leukocyte Esterase, UA Small (A) Negative   Nitrite, UA Negative Negative   Protein, UA Trace (A) Negative   Glucose, UA Negative Negative   Ketones, UA Trace (A) Negative   Urobilinogen, UA  1.0 mg/dL 0.1 - 1.0 mg/dL   Bilirubin, UA Negative Negative   Blood, UA Trace (A) Negative   RBC, UA 2 0 - 5 /HPF   WBC, UA >15 (H) <=5 /HPF   Bacteria, UA Packed (A) Few, Occasional, Small, None Seen /HPF   WBC Clumps Few (A) None Seen /HPF   No results found.  Medications Administered:  Medications  nitrofurantoin (macrocrystal-monohydrate) (MACROBID) 100 MG capsule 100 mg (100 mg Oral Given 12/02/23 2117)    Discharge Medications (Medications Prescribed during this  ED visit and Patient's Home Medications) :    Your Medication List     START taking these medications    nitrofurantoin (macrocrystal-monohydrate) 100 MG capsule Commonly known as: MACROBID Take 1 capsule (100 mg total) by mouth two (2) times a day for 5  days.       ASK your doctor about these medications    albuterol  90 mcg/actuation inhaler Commonly known as: PROVENTIL  HFA;VENTOLIN  HFA albuterol  sulfate HFA 90 mcg/actuation aerosol inhaler  inhale every 6 hrs   alfuzosin  10 mg 24 hr tablet Commonly known as: UROXATRAL  Take 1 tablet (10 mg total) by mouth Medrol Dose Pack Scheduling ONLY.   ALPRAZolam  1 MG tablet Commonly known as: XANAX  Take 1 tablet (1 mg total) by mouth Three (3) times a day as needed.   amiodarone 400 MG tablet Commonly known as: PACERONE Take 1 tablet (400 mg total) by mouth in the morning and 1 tablet (400 mg total) in the evening. Take with meals.   apixaban 5 mg Tab Commonly known as: ELIQUIS Take 1 tablet (5 mg total) by mouth two (2) times a day.   carbidopa -levodopa  25-100 mg per tablet Commonly known as: SINEMET  Take 1 tablet by mouth every three (3) hours.   divalproex ER 500 MG extended released 24 hr tablet Commonly known as: DEPAKOTE ER Take 1 tablet (500 mg total) by mouth two (2) times a day.   finasteride  5 mg tablet Commonly known as: PROSCAR  Take 1 tablet (5 mg total) by mouth daily.   fludrocortisone 0.1 mg tablet Commonly known as: FLORINEF Take 1  tablet (0.1 mg total) by mouth two (2) times a day.   midodrine  10 MG tablet Commonly known as: PROAMATINE  Take 1 tablet (10 mg total) by mouth Three (3) times a day.   rosuvastatin 20 MG tablet Commonly known as: CRESTOR Take 1 tablet (20 mg total) by mouth daily.          Rockey Mallick [place], [place] 12/02/23 2208

## 2023-12-02 NOTE — ED Triage Notes (Signed)
 Pt here for not being able to urinate x 36 hours. Also reports constipation, states he took a laxative and enema at home with relief. Reports hx of Parkinson's.

## 2023-12-09 DIAGNOSIS — G20A1 Parkinson's disease without dyskinesia, without mention of fluctuations: Secondary | ICD-10-CM | POA: Diagnosis not present

## 2023-12-09 DIAGNOSIS — I10 Essential (primary) hypertension: Secondary | ICD-10-CM | POA: Diagnosis not present

## 2023-12-09 DIAGNOSIS — R339 Retention of urine, unspecified: Secondary | ICD-10-CM | POA: Diagnosis not present

## 2023-12-09 DIAGNOSIS — E7849 Other hyperlipidemia: Secondary | ICD-10-CM | POA: Diagnosis not present

## 2024-02-23 DIAGNOSIS — I10 Essential (primary) hypertension: Secondary | ICD-10-CM | POA: Diagnosis not present

## 2024-02-23 DIAGNOSIS — E7849 Other hyperlipidemia: Secondary | ICD-10-CM | POA: Diagnosis not present

## 2024-02-23 DIAGNOSIS — Z0000 Encounter for general adult medical examination without abnormal findings: Secondary | ICD-10-CM | POA: Diagnosis not present

## 2024-02-23 DIAGNOSIS — R339 Retention of urine, unspecified: Secondary | ICD-10-CM | POA: Diagnosis not present

## 2024-02-23 DIAGNOSIS — G20A1 Parkinson's disease without dyskinesia, without mention of fluctuations: Secondary | ICD-10-CM | POA: Diagnosis not present

## 2024-02-27 DIAGNOSIS — Z0000 Encounter for general adult medical examination without abnormal findings: Secondary | ICD-10-CM | POA: Diagnosis not present

## 2024-02-27 DIAGNOSIS — I10 Essential (primary) hypertension: Secondary | ICD-10-CM | POA: Diagnosis not present

## 2024-02-27 DIAGNOSIS — E7849 Other hyperlipidemia: Secondary | ICD-10-CM | POA: Diagnosis not present

## 2024-02-27 DIAGNOSIS — G40311 Generalized idiopathic epilepsy and epileptic syndromes, intractable, with status epilepticus: Secondary | ICD-10-CM | POA: Diagnosis not present

## 2024-05-02 DIAGNOSIS — N3001 Acute cystitis with hematuria: Secondary | ICD-10-CM | POA: Diagnosis not present

## 2024-05-02 DIAGNOSIS — F17200 Nicotine dependence, unspecified, uncomplicated: Secondary | ICD-10-CM | POA: Diagnosis not present

## 2024-05-02 DIAGNOSIS — Z888 Allergy status to other drugs, medicaments and biological substances status: Secondary | ICD-10-CM | POA: Diagnosis not present

## 2024-05-02 DIAGNOSIS — Z885 Allergy status to narcotic agent status: Secondary | ICD-10-CM | POA: Diagnosis not present

## 2024-05-02 DIAGNOSIS — I951 Orthostatic hypotension: Secondary | ICD-10-CM | POA: Diagnosis not present

## 2024-05-02 DIAGNOSIS — R9431 Abnormal electrocardiogram [ECG] [EKG]: Secondary | ICD-10-CM | POA: Diagnosis not present

## 2024-05-02 DIAGNOSIS — S01111A Laceration without foreign body of right eyelid and periocular area, initial encounter: Secondary | ICD-10-CM | POA: Diagnosis not present

## 2024-05-02 DIAGNOSIS — W1830XA Fall on same level, unspecified, initial encounter: Secondary | ICD-10-CM | POA: Diagnosis not present

## 2024-05-02 DIAGNOSIS — Z5329 Procedure and treatment not carried out because of patient's decision for other reasons: Secondary | ICD-10-CM | POA: Diagnosis not present

## 2024-05-02 DIAGNOSIS — M47812 Spondylosis without myelopathy or radiculopathy, cervical region: Secondary | ICD-10-CM | POA: Diagnosis not present

## 2024-05-02 DIAGNOSIS — F32A Depression, unspecified: Secondary | ICD-10-CM | POA: Diagnosis not present

## 2024-05-02 DIAGNOSIS — S022XXA Fracture of nasal bones, initial encounter for closed fracture: Secondary | ICD-10-CM | POA: Diagnosis not present

## 2024-05-02 DIAGNOSIS — I77810 Thoracic aortic ectasia: Secondary | ICD-10-CM | POA: Diagnosis not present

## 2024-05-02 DIAGNOSIS — S199XXA Unspecified injury of neck, initial encounter: Secondary | ICD-10-CM | POA: Diagnosis not present

## 2024-05-02 DIAGNOSIS — Z79899 Other long term (current) drug therapy: Secondary | ICD-10-CM | POA: Diagnosis not present

## 2024-05-02 DIAGNOSIS — R55 Syncope and collapse: Secondary | ICD-10-CM | POA: Diagnosis not present

## 2024-05-02 DIAGNOSIS — S0990XA Unspecified injury of head, initial encounter: Secondary | ICD-10-CM | POA: Diagnosis not present

## 2024-05-02 DIAGNOSIS — S0181XA Laceration without foreign body of other part of head, initial encounter: Secondary | ICD-10-CM | POA: Diagnosis not present

## 2024-05-02 DIAGNOSIS — E785 Hyperlipidemia, unspecified: Secondary | ICD-10-CM | POA: Diagnosis not present

## 2024-05-02 DIAGNOSIS — Z91040 Latex allergy status: Secondary | ICD-10-CM | POA: Diagnosis not present

## 2024-05-02 NOTE — ED Triage Notes (Signed)
 Patient had syncopal episode while bending over in wheelchair to feed dog. Hit head and has laceration to right eyebrow. Patient had syncopal episode a couple days prior and fell hitting left side of head. Patient reports he's been having syncopal episodes for about a year.
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
## 2024-05-06 ENCOUNTER — Encounter: Payer: Self-pay | Admitting: Internal Medicine

## 2024-05-19 ENCOUNTER — Telehealth: Payer: Self-pay | Admitting: *Deleted

## 2024-05-19 ENCOUNTER — Ambulatory Visit: Admitting: Internal Medicine

## 2024-05-19 ENCOUNTER — Other Ambulatory Visit: Payer: Self-pay | Admitting: *Deleted

## 2024-05-19 ENCOUNTER — Encounter: Payer: Self-pay | Admitting: Internal Medicine

## 2024-05-19 VITALS — BP 120/82 | HR 80 | Temp 97.5°F | Ht 70.0 in | Wt 125.4 lb

## 2024-05-19 DIAGNOSIS — R634 Abnormal weight loss: Secondary | ICD-10-CM

## 2024-05-19 DIAGNOSIS — R109 Unspecified abdominal pain: Secondary | ICD-10-CM | POA: Diagnosis not present

## 2024-05-19 DIAGNOSIS — D649 Anemia, unspecified: Secondary | ICD-10-CM | POA: Diagnosis not present

## 2024-05-19 DIAGNOSIS — Z860101 Personal history of adenomatous and serrated colon polyps: Secondary | ICD-10-CM

## 2024-05-19 DIAGNOSIS — Z860100 Personal history of colon polyps, unspecified: Secondary | ICD-10-CM

## 2024-05-19 DIAGNOSIS — R1030 Lower abdominal pain, unspecified: Secondary | ICD-10-CM

## 2024-05-19 DIAGNOSIS — K5909 Other constipation: Secondary | ICD-10-CM

## 2024-05-19 DIAGNOSIS — K5900 Constipation, unspecified: Secondary | ICD-10-CM

## 2024-05-19 DIAGNOSIS — R630 Anorexia: Secondary | ICD-10-CM

## 2024-05-19 DIAGNOSIS — Z800 Family history of malignant neoplasm of digestive organs: Secondary | ICD-10-CM

## 2024-05-19 NOTE — Telephone Encounter (Signed)
 [place] PA for CTA: Order ID: [id number]       Authorized  Approval Valid Through: 05/19/2024 - 06/17/2024

## 2024-05-19 NOTE — Progress Notes (Unsigned)
 Primary Care Physician:  Orpha Yancey LABOR, MD Primary Gastroenterologist:  Dr. Cindie  Chief Complaint  Patient presents with   New Patient (Initial Visit)    Patient here today as a new patient due to having issues with constipation, lower abdominal pain. He has been several times to the Ed due to constipation. Patient uses miralax daily, and this does not help. He says he has to take an enema in order to have a bm.m     HPI:   Michael Fitzgerald is a 73 y.o. male who presents to the clinic today as a new patient with multiple GI issues.  Abdominal pain, weight loss: Patient notes losing approximately 25 pounds over the last year.  Unintentional.  Notes poor appetite.  Also with associated abdominal pain.  Abdominal pain primarily lower left quadrant and right quadrant.  Worse after eating.  Significant vasculopath, chronic smoker.  Anemia: Blood work from 05/02/2024 showed hemoglobin 11.6, MCV 93.1.  I would point in June 2024 his hemoglobin got down to as low as 9.7.  Denies any gross melena hematochezia.  Personal history of colon polyps, family history of colon cancer in mother: Patient reports colonoscopy 2016 with polyps removed.  Also notes family history of colon cancer in his mother.    Chronic constipation: Patient reports issues with constipation, having bowel movements 1-2 times a week.  No straining.  This is prompted multiple ER visits.  Has tried OTC stool softeners including MiraLAX which does not help.  Has to use an enema in order to have a bowel movement.   Past Medical History:  Diagnosis Date   Anxiety 03/25/2011   Arthritis    Blood in the urine    hx -hole in lft kidney no problems generally no renal dr in yrs   Complication of anesthesia    wild when wakes up   COPD (chronic obstructive pulmonary disease) (HCC)    slight   Depression    Erectile dysfunction 03/25/2011   GERD (gastroesophageal reflux disease)    rolaids occ   Hepatitis C 03/25/2011    Manic depressive disease manic phase (HCC)    won't take med   Night terrors    Parkinsonism (HCC)    RLS (restless legs syndrome)    Sleep apnea    no cpap   Sleep walking     Past Surgical History:  Procedure Laterality Date   COLONOSCOPY  03/25/2011,16   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   LIVER BIOPSY     Dr. Mixon at [place] over 10 yrs ago.   PAROTIDECTOMY Left 05/25/2015   PAROTIDECTOMY Left 05/25/2015   Procedure: LEFT PAROTIDECTOMY;  Surgeon: Ida Loader, MD;  Location: [facility] OR;  Service: ENT;  Laterality: Left;   TONSILLECTOMY      Current Outpatient Medications  Medication Sig Dispense Refill   albuterol  (PROVENTIL  HFA;VENTOLIN  HFA) 108 (90 BASE) MCG/ACT inhaler Inhale into the lungs every 6 (six) hours as needed for wheezing or shortness of breath.     alfuzosin  (UROXATRAL ) 10 MG 24 hr tablet Take 10 mg by mouth daily with breakfast.     carbidopa -levodopa  (SINEMET  IR) 25-100 MG tablet TAKE 2 TABLETS FOUR TIMES DAILY 720 tablet 4   carbidopa -levodopa -entacapone (STALEVO) 31.25-125-200 MG tablet Take 2 tablets by mouth 2 (two) times daily.     cyanocobalamin  (VITAMIN B12) 500 MCG tablet Take 500 mcg by mouth daily.     finasteride  (  PROSCAR ) 5 MG tablet Take 1 tablet (5 mg total) by mouth daily. 90 tablet 3   magnesium oxide (MAG-OX) 400 (240 Mg) MG tablet Take 400 mg by mouth 2 (two) times daily.     potassium chloride (KLOR-CON) 10 MEQ tablet Take 10 mEq by mouth daily.     rOPINIRole  (REQUIP ) 3 MG tablet Take by mouth. (Patient taking differently: Take 1 mg by mouth 2 (two) times daily.)     rosuvastatin (CRESTOR) 20 MG tablet Take 20 mg by mouth daily.     tizanidine (ZANAFLEX) 2 MG capsule Take 2 mg by mouth 2 (two) times daily.     ALPRAZolam  (XANAX ) 1 MG tablet Take 1 mg by mouth 2 (two) times daily.  (Patient not taking: Reported on 05/19/2024)     donepezil  (ARICEPT ) 10 MG tablet TAKE 1 TABLET EVERY DAY AT BEDTIME 90 tablet 1    fludrocortisone (FLORINEF) 0.1 MG tablet Take 0.1 mg by mouth daily. Not in box of meds and patient is unsure.     midodrine  (PROAMATINE ) 5 MG tablet Take 1.5 tablets (7.5 mg total) by mouth 3 (three) times daily with meals. 135 tablet 2   mirabegron  ER (MYRBETRIQ ) 50 MG TB24 tablet Take 1 tablet (50 mg total) by mouth daily. 30 tablet 11   rasagiline (AZILECT) 0.5 MG TABS tablet Take 0.5 mg by mouth daily as needed.     rasagiline (AZILECT) 1 MG TABS tablet Take 1 mg by mouth daily.     No current facility-administered medications for this visit.    Allergies as of 05/19/2024 - Review Complete 05/19/2024  Allergen Reaction Noted   Latex Other (See Comments) 05/25/2015   Gabapentin Other (See Comments) 02/03/2019   Prednisone  07/01/2011   Sildenafil citrate  07/01/2011    Family History  Problem Relation Age of Onset   Colon cancer Mother    Other Father        unsure of history    Social History   Socioeconomic History   Marital status: Married    Spouse name: Not on file   Number of children: 2   Years of education: 12   Highest education level: High school graduate  Occupational History   Occupation: Retired  Tobacco Use   Smoking status: Every Day    Current packs/day: 0.50    Average packs/day: 0.5 packs/day for 30.0 years (15.0 ttl pk-yrs)    Types: Cigarettes   Smokeless tobacco: Never  Vaping Use   Vaping status: Never Used  Substance and Sexual Activity   Alcohol  use: No    Comment: quit   Drug use: Yes    Comment: drugs in past.  Cocaine. 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 2 tatoos on rt lower leg none since age 46.   Sexual activity: Not on file  Other Topics Concern   Not on file  Social History Narrative   Lives at home with wife,   Right-handed.   3 cups caffeine per day.   Social Drivers of Corporate investment banker Strain: Low Risk  (05/16/2023)   Received from [facility]   Overall Financial Resource Strain (CARDIA)    Difficulty of Paying Living  Expenses: Not hard at all  Food Insecurity: No Food Insecurity (05/16/2023)   Received from [facility]   Hunger Vital Sign    Within the past 12 months, you worried that your food would run out before you got the money to buy more.: Never true  Within the past 12 months, the food you bought just didn't last and you didn't have money to get more.: Never true  Transportation Needs: No Transportation Needs (05/16/2023)   Received from [facility] - Transportation    Lack of Transportation (Medical): No    Lack of Transportation (Non-Medical): No  Physical Activity: Not on file  Stress: Not on file  Social Connections: Unknown (05/16/2023)   Received from [facility]   Social Connection and Isolation Panel    In a typical week, how many times do you talk on the phone with family, friends, or neighbors?: More than three times a week    How often do you get together with friends or relatives?: More than three times a week    Attends Religious Services: Not on file    Active Member of Clubs or Organizations: Not on file    Attends Club or Organization Meetings: Not on file    Are you married, widowed, divorced, separated, never married, or living with a partner?: Married  Intimate Partner Violence: Not At Risk (05/16/2023)   Received from [facility]   Humiliation, Afraid, Rape, and Kick questionnaire    Within the last year, have you been afraid of your partner or ex-partner?: No    Within the last year, have you been humiliated or emotionally abused in other ways by your partner or ex-partner?: No    Within the last year, have you been kicked, hit, slapped, or otherwise physically hurt by your partner or ex-partner?: No    Within the last year, have you been raped or forced to have any kind of sexual activity by your partner or ex-partner?: No    Subjective: Review of Systems  Constitutional:  Positive for malaise/fatigue and weight loss. Negative for chills and  fever.  HENT:  Negative for congestion and hearing loss.   Eyes:  Negative for blurred vision and double vision.  Respiratory:  Negative for cough and shortness of breath.   Cardiovascular:  Negative for chest pain and palpitations.  Gastrointestinal:  Positive for constipation. Negative for abdominal pain, blood in stool, diarrhea, heartburn, melena and vomiting.  Genitourinary:  Negative for dysuria and urgency.  Musculoskeletal:  Negative for joint pain and myalgias.  Skin:  Negative for itching and rash.  Neurological:  Negative for dizziness and headaches.  Psychiatric/Behavioral:  Negative for depression. The patient is not nervous/anxious.        Objective: BP 120/82 (BP Location: Left Arm, Patient Position: Sitting, Cuff Size: Normal)   Pulse 80   Temp (!) 97.5 F (36.4 C) (Temporal)   Ht 5' 10 (1.778 m)   Wt 125 lb 6.4 oz (56.9 kg)   BMI 17.99 kg/m  Physical Exam Constitutional:      Comments: Chronically ill-appearing  HENT:     Head: Normocephalic and atraumatic.  Eyes:     Extraocular Movements: Extraocular movements intact.     Conjunctiva/sclera: Conjunctivae normal.  Cardiovascular:     Rate and Rhythm: Normal rate and regular rhythm.  Pulmonary:     Effort: Pulmonary effort is normal.     Breath sounds: Normal breath sounds.  Abdominal:     General: Bowel sounds are normal.     Palpations: Abdomen is soft.  Musculoskeletal:        General: Normal range of motion.     Cervical back: Normal range of motion and neck supple.  Skin:    General: Skin is  warm.  Neurological:     General: No focal deficit present.     Mental Status: He is alert and oriented to person, place, and time.  Psychiatric:        Mood and Affect: Mood normal.        Behavior: Behavior normal.      Assessment/Plan:  1.  Abdominal pain, weight loss-etiology unclear.  Possibly his constipation causing issues with abdominal pain.  Concerned about possible underlying malignancy.   Will order CT angio abdomen pelvis to further evaluate.  Call with results.  Recommend he institute 1-2 protein shakes daily.  2.  Personal history of adenomatous colon polyps, family history of colon cancer in mother-certainly due for colonoscopy to further evaluate though given his numerous comorbidities, does have increased risk of anesthesia.  Pending CT results will possibly pursue this, discussed on follow-up visit in 4 to 6 weeks.  3.  Anemia-check iron panel B12 today.  4.  Chronic constipation-samples of Linzess 290 mcg daily given today.  Counseled on initial washout.  He understands.  Call with update in 1 to 2 weeks and I can send in formal prescription if improved.  Follow-up in 4 to 6 weeks   05/19/2024 2:08 PM   Disclaimer: This note was dictated with voice recognition software. Similar sounding words can inadvertently be transcribed and may not be corrected upon review.

## 2024-05-19 NOTE — Patient Instructions (Signed)
 I am going to order a CT of your abdomen pelvis to further evaluate your symptoms.  I am going to check blood work at [facility] given your anemia.  For your constipation I am going to give you samples of Linzess 290 mcg daily.  Linzess works best when taken once a day every day, on an empty stomach, at least 30 minutes before your first meal of the day.  When Linzess is taken daily as directed:  *Constipation relief is typically felt in about a week *IBS-C patients may begin to experience relief from belly pain and overall abdominal symptoms (pain, discomfort, and bloating) in about 1 week,   with symptoms typically improving over 12 weeks.  Diarrhea may occur in the first 2 weeks -keep taking it.  The diarrhea should go away and you should start having normal, complete, full bowel movements. It may be helpful to start treatment when you can be near the comfort of your own bathroom, such as a weekend.   Follow-up in 4 to 6 weeks.  It was very nice meeting both you today.  Dr. Cindie

## 2024-05-20 LAB — IRON,TIBC AND FERRITIN PANEL
%SAT: 34 % (ref 20–48)
Ferritin: 212 ng/mL (ref 24–380)
Iron: 212 ng/mL (ref 50–380)
TIBC: 268 ug/dL (ref 250–425)

## 2024-05-20 LAB — B12 AND FOLATE PANEL
Folate: 9.3 ng/mL
Vitamin B-12: 333 pg/mL (ref 200–1100)

## 2024-05-26 DIAGNOSIS — G20A1 Parkinson's disease without dyskinesia, without mention of fluctuations: Secondary | ICD-10-CM | POA: Diagnosis not present

## 2024-05-26 DIAGNOSIS — G40311 Generalized idiopathic epilepsy and epileptic syndromes, intractable, with status epilepticus: Secondary | ICD-10-CM | POA: Diagnosis not present

## 2024-05-26 DIAGNOSIS — I10 Essential (primary) hypertension: Secondary | ICD-10-CM | POA: Diagnosis not present

## 2024-05-26 DIAGNOSIS — E7849 Other hyperlipidemia: Secondary | ICD-10-CM | POA: Diagnosis not present

## 2024-05-26 DIAGNOSIS — Z0000 Encounter for general adult medical examination without abnormal findings: Secondary | ICD-10-CM | POA: Diagnosis not present

## 2024-06-01 ENCOUNTER — Ambulatory Visit (HOSPITAL_COMMUNITY): Admission: RE | Admit: 2024-06-01 | Source: Ambulatory Visit

## 2024-06-03 ENCOUNTER — Ambulatory Visit (HOSPITAL_COMMUNITY)
Admission: RE | Admit: 2024-06-03 | Discharge: 2024-06-03 | Disposition: A | Discharge status: Home / Self Care | Source: Ambulatory Visit | Attending: Internal Medicine | Admitting: Internal Medicine

## 2024-06-03 ENCOUNTER — Telehealth: Payer: Self-pay

## 2024-06-03 DIAGNOSIS — R634 Abnormal weight loss: Secondary | ICD-10-CM | POA: Insufficient documentation

## 2024-06-03 DIAGNOSIS — R1030 Lower abdominal pain, unspecified: Secondary | ICD-10-CM | POA: Insufficient documentation

## 2024-06-03 DIAGNOSIS — I712 Thoracic aortic aneurysm, without rupture, unspecified: Secondary | ICD-10-CM | POA: Diagnosis not present

## 2024-06-03 MED ORDER — IOHEXOL 350 MG/ML SOLN
75.0000 mL | Freq: Once | INTRAVENOUS | Status: AC | PRN
  Administered 2024-06-03: 75 mL via INTRAVENOUS

## 2024-06-03 MED ORDER — IOHEXOL 300 MG/ML  SOLN: 100.0000 mL | Freq: Once | INTRAMUSCULAR | Status: DC | PRN

## 2024-06-03 NOTE — Telephone Encounter (Signed)
 Pt phoned in that the samples of Linzess 290 mcg worked and he wants a Rx sent in to his pharmacy. Please advise

## 2024-06-14 ENCOUNTER — Other Ambulatory Visit: Payer: Self-pay | Admitting: Internal Medicine

## 2024-06-14 ENCOUNTER — Telehealth: Payer: Self-pay

## 2024-06-14 MED ORDER — LINACLOTIDE 290 MCG PO CAPS: 290.0000 ug | ORAL_CAPSULE | Freq: Every day | ORAL | 3 refills | Status: AC

## 2024-06-14 NOTE — Telephone Encounter (Signed)
 CT did not show any evidence of mesenteric ischemia, blood flow to his GI tract is adequate.  He does have significant constipation which may be contributing to his abdominal pain. I will send in formal prescription for Linzess  to his pharmacy.  We may need to consider colonoscopy to further evaluate. Discuss further on follow-up visit.    Phoned and advised the pt of his report and I will leave samples of Linzess  up front for the pt to pick up. Linzess  .

## 2024-06-14 NOTE — Telephone Encounter (Signed)
 Sent Dr Cindie in secure chat a message from the patient needing the results of his CT scan from 08/04/2024. Will advise the pt once I have the results.

## 2024-06-15 DIAGNOSIS — Z79899 Other long term (current) drug therapy: Secondary | ICD-10-CM | POA: Diagnosis not present

## 2024-06-15 DIAGNOSIS — G4733 Obstructive sleep apnea (adult) (pediatric): Secondary | ICD-10-CM | POA: Diagnosis not present

## 2024-06-15 DIAGNOSIS — S2239XA Fracture of one rib, unspecified side, initial encounter for closed fracture: Secondary | ICD-10-CM | POA: Diagnosis not present

## 2024-06-15 DIAGNOSIS — G20A1 Parkinson's disease without dyskinesia, without mention of fluctuations: Secondary | ICD-10-CM | POA: Diagnosis not present

## 2024-06-15 DIAGNOSIS — F419 Anxiety disorder, unspecified: Secondary | ICD-10-CM | POA: Diagnosis not present

## 2024-06-15 DIAGNOSIS — R0602 Shortness of breath: Secondary | ICD-10-CM | POA: Diagnosis not present

## 2024-06-15 DIAGNOSIS — F17210 Nicotine dependence, cigarettes, uncomplicated: Secondary | ICD-10-CM | POA: Diagnosis not present

## 2024-06-15 DIAGNOSIS — E785 Hyperlipidemia, unspecified: Secondary | ICD-10-CM | POA: Diagnosis not present

## 2024-06-15 DIAGNOSIS — N400 Enlarged prostate without lower urinary tract symptoms: Secondary | ICD-10-CM | POA: Diagnosis not present

## 2024-06-15 DIAGNOSIS — I951 Orthostatic hypotension: Secondary | ICD-10-CM | POA: Diagnosis not present

## 2024-06-15 DIAGNOSIS — D62 Acute posthemorrhagic anemia: Secondary | ICD-10-CM | POA: Diagnosis not present

## 2024-06-15 DIAGNOSIS — R0682 Tachypnea, not elsewhere classified: Secondary | ICD-10-CM | POA: Diagnosis not present

## 2024-06-15 DIAGNOSIS — Z91040 Latex allergy status: Secondary | ICD-10-CM | POA: Diagnosis not present

## 2024-06-15 DIAGNOSIS — G4700 Insomnia, unspecified: Secondary | ICD-10-CM | POA: Diagnosis not present

## 2024-06-15 DIAGNOSIS — S0990XA Unspecified injury of head, initial encounter: Secondary | ICD-10-CM | POA: Diagnosis not present

## 2024-06-15 DIAGNOSIS — F32A Depression, unspecified: Secondary | ICD-10-CM | POA: Diagnosis not present

## 2024-06-15 DIAGNOSIS — R410 Disorientation, unspecified: Secondary | ICD-10-CM | POA: Diagnosis not present

## 2024-06-15 DIAGNOSIS — M5030 Other cervical disc degeneration, unspecified cervical region: Secondary | ICD-10-CM | POA: Diagnosis not present

## 2024-06-15 DIAGNOSIS — I7143 Infrarenal abdominal aortic aneurysm, without rupture: Secondary | ICD-10-CM | POA: Diagnosis not present

## 2024-06-15 DIAGNOSIS — R636 Underweight: Secondary | ICD-10-CM | POA: Diagnosis not present

## 2024-06-15 DIAGNOSIS — J9311 Primary spontaneous pneumothorax: Secondary | ICD-10-CM | POA: Diagnosis not present

## 2024-06-15 DIAGNOSIS — R42 Dizziness and giddiness: Secondary | ICD-10-CM | POA: Diagnosis not present

## 2024-06-15 DIAGNOSIS — M1991 Primary osteoarthritis, unspecified site: Secondary | ICD-10-CM | POA: Diagnosis not present

## 2024-06-15 DIAGNOSIS — J939 Pneumothorax, unspecified: Secondary | ICD-10-CM | POA: Diagnosis not present

## 2024-06-15 DIAGNOSIS — S199XXA Unspecified injury of neck, initial encounter: Secondary | ICD-10-CM | POA: Diagnosis not present

## 2024-06-15 DIAGNOSIS — Z4682 Encounter for fitting and adjustment of non-vascular catheter: Secondary | ICD-10-CM | POA: Diagnosis not present

## 2024-06-15 DIAGNOSIS — I712 Thoracic aortic aneurysm, without rupture, unspecified: Secondary | ICD-10-CM | POA: Diagnosis not present

## 2024-06-15 DIAGNOSIS — R9431 Abnormal electrocardiogram [ECG] [EKG]: Secondary | ICD-10-CM | POA: Diagnosis not present

## 2024-06-15 DIAGNOSIS — I959 Hypotension, unspecified: Secondary | ICD-10-CM | POA: Diagnosis not present

## 2024-06-15 DIAGNOSIS — I10 Essential (primary) hypertension: Secondary | ICD-10-CM | POA: Diagnosis not present

## 2024-06-15 DIAGNOSIS — S2241XA Multiple fractures of ribs, right side, initial encounter for closed fracture: Secondary | ICD-10-CM | POA: Diagnosis not present

## 2024-06-15 DIAGNOSIS — S270XXA Traumatic pneumothorax, initial encounter: Secondary | ICD-10-CM | POA: Diagnosis not present

## 2024-06-15 DIAGNOSIS — R569 Unspecified convulsions: Secondary | ICD-10-CM | POA: Diagnosis not present

## 2024-06-15 DIAGNOSIS — R0781 Pleurodynia: Secondary | ICD-10-CM | POA: Diagnosis not present

## 2024-06-15 DIAGNOSIS — N401 Enlarged prostate with lower urinary tract symptoms: Secondary | ICD-10-CM | POA: Diagnosis not present

## 2024-06-15 DIAGNOSIS — I2510 Atherosclerotic heart disease of native coronary artery without angina pectoris: Secondary | ICD-10-CM | POA: Diagnosis not present

## 2024-06-15 DIAGNOSIS — M949 Disorder of cartilage, unspecified: Secondary | ICD-10-CM | POA: Diagnosis not present

## 2024-06-15 DIAGNOSIS — Z7901 Long term (current) use of anticoagulants: Secondary | ICD-10-CM | POA: Diagnosis not present

## 2024-06-15 DIAGNOSIS — G9349 Other encephalopathy: Secondary | ICD-10-CM | POA: Diagnosis not present

## 2024-06-15 DIAGNOSIS — W19XXXA Unspecified fall, initial encounter: Secondary | ICD-10-CM | POA: Diagnosis not present

## 2024-06-15 DIAGNOSIS — I3139 Other pericardial effusion (noninflammatory): Secondary | ICD-10-CM | POA: Diagnosis not present

## 2024-06-15 DIAGNOSIS — T797XXA Traumatic subcutaneous emphysema, initial encounter: Secondary | ICD-10-CM | POA: Diagnosis not present

## 2024-06-15 DIAGNOSIS — K5903 Drug induced constipation: Secondary | ICD-10-CM | POA: Diagnosis not present

## 2024-06-15 DIAGNOSIS — I517 Cardiomegaly: Secondary | ICD-10-CM | POA: Diagnosis not present

## 2024-06-15 DIAGNOSIS — R296 Repeated falls: Secondary | ICD-10-CM | POA: Diagnosis not present

## 2024-06-15 DIAGNOSIS — S29009A Unspecified injury of muscle and tendon of unspecified wall of thorax, initial encounter: Secondary | ICD-10-CM | POA: Diagnosis not present

## 2024-06-15 DIAGNOSIS — T40605A Adverse effect of unspecified narcotics, initial encounter: Secondary | ICD-10-CM | POA: Diagnosis not present

## 2024-06-15 DIAGNOSIS — R339 Retention of urine, unspecified: Secondary | ICD-10-CM | POA: Diagnosis not present

## 2024-06-15 DIAGNOSIS — Z781 Physical restraint status: Secondary | ICD-10-CM | POA: Diagnosis not present

## 2024-06-15 DIAGNOSIS — N390 Urinary tract infection, site not specified: Secondary | ICD-10-CM | POA: Diagnosis not present

## 2024-06-15 DIAGNOSIS — R1031 Right lower quadrant pain: Secondary | ICD-10-CM | POA: Diagnosis not present

## 2024-06-15 DIAGNOSIS — I4891 Unspecified atrial fibrillation: Secondary | ICD-10-CM | POA: Diagnosis not present

## 2024-06-15 DIAGNOSIS — S2231XA Fracture of one rib, right side, initial encounter for closed fracture: Secondary | ICD-10-CM | POA: Diagnosis not present

## 2024-06-15 DIAGNOSIS — Z7952 Long term (current) use of systemic steroids: Secondary | ICD-10-CM | POA: Diagnosis not present
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
## 2024-06-15 NOTE — ED Notes (Signed)
 C-Collar placed on pt per Kieran, MD due to pt reporting cervical tenderness   Roe Romberg, RN 06/15/24 2321

## 2024-06-15 NOTE — ED Triage Notes (Signed)
 BIB [facility] from OSH r/t fall. Per report, pt had a fall around 1500 today. Pt went to [facility] where they found right pneumo and right rib f/x. Pt A&O x4, GCS 15 on arrival. Trauma team at bedside

## 2024-06-15 NOTE — ED Provider Notes (Signed)
 ------------------------------------------------------------------------------- Attestation signed by Cherie Ardeen Hanger, MD at 06/21/24 1739  I have reviewed the NP/PA's documentation and agree with the NP/PA's assessment and plan of care.  I had face to face evaluation of the patient. Here is my MDM:    Michael Fitzgerald is a 73 y.o. male  who presents with a complaint of right-sided rib pain status post a fall that occurred today.  Patient states that he decided a piece of furniture.  There was no loss of consciousness.  On exam, he does have some crepitus.  He does have right-sided rib tenderness.  Bilateral lung sounds. PMHx:  has a past medical history of AAA (abdominal aortic aneurysm), Anxiety, Arthritis, Depression, Parkinson disease, and Seizures. PSHx:  has a past surgical history that includes Cataract extraction (Bilateral, 02/2023).  DDX:  Differential diagnosis includes rib contusion versus fracture versus pneumothorax.   Patient's work-up reveals very small apical pneumothorax.  No indication for chest tube at this time.  Patient is otherwise stable.  Given trauma, patient will be transferred.     ED Results Results for orders placed or performed during the hospital encounter of 06/15/24  Comprehensive Metabolic Panel  Result Value Ref Range   Sodium 141 135 - 145 mmol/L   Potassium 4.5 3.5 - 5.0 mmol/L   Chloride 107 98 - 107 mmol/L   CO2 23.7 21.0 - 32.0 mmol/L   Anion Gap 10 3 - 11 mmol/L   BUN 16 8 - 20 mg/dL   Creatinine 9.22 (L) 9.19 - 1.30 mg/dL   BUN/Creatinine Ratio 21    eGFR CKD-EPI (2021) Male >90 >=60 mL/min/1.73m2   Glucose 87 70 - 179 mg/dL   Calcium 8.7 8.5 - 89.8 mg/dL   Albumin 3.6 3.5 - 5.0 g/dL   Total Protein 6.6 6.0 - 8.0 g/dL   Total Bilirubin 0.4 0.3 - 1.2 mg/dL   AST 15 15 - 40 U/L   ALT 7 (L) 12 - 78 U/L   Alkaline Phosphatase 85 46 - 116 U/L  CBC w/ Differential  Result Value Ref Range   WBC 10.2 4.0 - 10.5 10*9/L   RBC 3.55  (L) 4.10 - 5.60 10*12/L   HGB 11.5 (L) 12.5 - 17.0 g/dL   HCT 65.5 (L) 63.9 - 49.9 %   MCV 96.9 80.0 - 98.0 fL   MCH 32.4 27.0 - 34.0 pg   MCHC 33.4 32.0 - 36.0 g/dL   RDW 85.3 (H) 88.4 - 85.4 %   MPV 9.4 7.4 - 10.4 fL   Platelet 170 140 - 415 10*9/L   Neutrophils % 84.4 %   Lymphocytes % 9.4 %   Monocytes % 5.1 %   Eosinophils % 0.2 %   Basophils % 0.4 %   Absolute Neutrophils 8.6 (H) 1.8 - 7.8 10*9/L   Absolute Lymphocytes 1.0 0.7 - 4.5 10*9/L   Absolute Monocytes 0.5 0.1 - 1.0 10*9/L   Absolute Eosinophils 0.0 0.0 - 0.4 10*9/L   Absolute Basophils 0.0 0.0 - 0.2 10*9/L   No results found.  No results found for this visit on 06/15/24 (from the past 4464 hours).  Clinical Impression:   Final diagnoses:  Closed fracture of one rib of right side, initial encounter (Primary)  Pneumothorax, unspecified type    Condition: Stable Disposition: Transfer  Signed by Ardeen SHAUNNA Cherie, MD June 21, 2024 at 5:38 PM    -------------------------------------------------------------------------------  Emergency Department Provider Note    ED Clinical Impression   Final diagnoses:  Closed fracture of one rib of right side, initial encounter (Primary)  Pneumothorax, unspecified type    ED Assessment/Plan    Condition: Stable Disposition: Transfer  This chart has been completed using Engineer, civil (consulting) software, and while attempts have been made to ensure accuracy, certain words and phrases may not be transcribed as intended.   History   Chief Complaint  Patient presents with  . Fall   HPI  Michael Fitzgerald is a 73 y.o. male  who presents today to the  emergency department complaining of right sided rib pain and shob after fall pta.   Pt has PMH of Parkinson's and has frequent falls.   He denies hitting his head or LOC.   Denies neck/back pain, abd pain.   Pt writhing around  in bed in apparent distress.      Allergies: is allergic to latex, gabapentin, prednisone, pregabalin , and sildenafil citrate. Medications: has a current medication list which includes the following long-term medication(s): albuterol , amiodarone, carbidopa -levodopa , and rosuvastatin. PMHx:  has a past medical history of AAA (abdominal aortic aneurysm), Anxiety, Arthritis, Depression, Parkinson disease, and Seizures. PSHx:  has a past surgical history that includes Cataract extraction (Bilateral, 02/2023). SocHx:  reports that he has been smoking cigarettes. He started smoking about 22 months ago. He has a 51 pack-year smoking history. He has never used smokeless tobacco. He reports that he does not currently use alcohol . He reports that he does not currently use drugs. Allergies, Medications, Medical, Surgical, and Social History were reviewed as documented above.   Social Drivers of Health with Concerns   Tobacco Use: High Risk (05/19/2024)   Received from [facility]   Patient History   . Smoking Tobacco Use: Every Day   . Smokeless Tobacco Use: Never   . Passive Exposure: Not on file  Physical Activity: Not on file  Stress: Not on file  Substance Use: Not on file (05/17/2024)  Social Connections: Unknown (05/16/2023)   Social Connection and Isolation Panel   . Frequency of Communication with Friends and Family: More than three times a week   . Frequency of Social Gatherings with Friends and Family: More than three times a week   . Attends Religious Services: Not on file   . Active Member of Clubs or Organizations: Not on file   . Attends Banker Meetings: Not on file   . Marital Status: Married  Health Literacy: Medium Risk (05/17/2023)   Health Literacy   . : Sometimes  Internet Connectivity: Not on file     Review Of Systems  Review of Systems  Respiratory:  Positive for shortness of breath.   Musculoskeletal:  Negative for back pain and neck pain.       Chest  wall pain    Physical Exam   BP 145/90   Pulse 87   Temp 36.6 C (97.9 F)   Resp 19   SpO2 98%   Physical Exam Constitutional:      General: He is in acute distress.     Appearance: Normal appearance. He is normal weight. He is not ill-appearing or diaphoretic.  HENT:     Head: Normocephalic.  Eyes:     Conjunctiva/sclera: Conjunctivae normal.  Cardiovascular:     Rate and Rhythm: Normal rate and regular rhythm.     Pulses: Normal pulses.     Heart sounds: Normal heart sounds.  Pulmonary:  Effort: Respiratory distress present.     Breath sounds: Normal breath sounds. No wheezing.     Comments: Pt tachypneic;  reduced breath sounds on right side;   also palpable subcutaneous emphysema noted to right chest wall Abdominal:     General: Abdomen is flat. Bowel sounds are normal.     Palpations: Abdomen is soft.  Musculoskeletal:     Cervical back: Normal range of motion and neck supple.  Skin:    General: Skin is warm.  Neurological:     General: No focal deficit present.     Mental Status: He is alert.  Psychiatric:        Mood and Affect: Mood normal.     ED Course  Medical Decision Making Pt presenting with traumatic injury to chest wall after fall pta;  in apparent stress.   Subcutaneous emphysema felt to right side of chest wall on initial exam;  portable cxr ordered to evaluate for pneumothorax;  nothing apparent on portable;   pan scan also ordered;   small pneumothorax noted by radiology;  will put pt on oxygen and contact [facility].       Procedures   No results found for this visit on 06/15/24 (from the past 4464 hours).   ED Results Results for orders placed or performed during the hospital encounter of 06/15/24  Comprehensive Metabolic Panel  Result Value Ref Range   Sodium 141 135 - 145 mmol/L   Potassium 4.5 3.5 - 5.0 mmol/L   Chloride 107 98 - 107 mmol/L   CO2 23.7 21.0 - 32.0 mmol/L   Anion Gap 10 3 - 11 mmol/L   BUN 16 8 - 20 mg/dL    Creatinine 9.22 (L) 0.80 - 1.30 mg/dL   BUN/Creatinine Ratio 21    eGFR CKD-EPI (2021) Male >90 >=60 mL/min/1.59m2   Glucose 87 70 - 179 mg/dL   Calcium 8.7 8.5 - 89.8 mg/dL   Albumin 3.6 3.5 - 5.0 g/dL   Total Protein 6.6 6.0 - 8.0 g/dL   Total Bilirubin 0.4 0.3 - 1.2 mg/dL   AST 15 15 - 40 U/L   ALT 7 (L) 12 - 78 U/L   Alkaline Phosphatase 85 46 - 116 U/L  CBC w/ Differential  Result Value Ref Range   WBC 10.2 4.0 - 10.5 10*9/L   RBC 3.55 (L) 4.10 - 5.60 10*12/L   HGB 11.5 (L) 12.5 - 17.0 g/dL   HCT 65.5 (L) 63.9 - 49.9 %   MCV 96.9 80.0 - 98.0 fL   MCH 32.4 27.0 - 34.0 pg   MCHC 33.4 32.0 - 36.0 g/dL   RDW 85.3 (H) 88.4 - 85.4 %   MPV 9.4 7.4 - 10.4 fL   Platelet 170 140 - 415 10*9/L   Neutrophils % 84.4 %   Lymphocytes % 9.4 %   Monocytes % 5.1 %   Eosinophils % 0.2 %   Basophils % 0.4 %   Absolute Neutrophils 8.6 (H) 1.8 - 7.8 10*9/L   Absolute Lymphocytes 1.0 0.7 - 4.5 10*9/L   Absolute Monocytes 0.5 0.1 - 1.0 10*9/L   Absolute Eosinophils 0.0 0.0 - 0.4 10*9/L   Absolute Basophils 0.0 0.0 - 0.2 10*9/L   CT Chest Abdomen Pelvis Wo Contrast Result Date: 06/15/2024 Exam: CT of the Chest, Abdomen and Pelvis without Contrast  History: Fall at home with injury and right flank pain. History of Parkinson's disease.  Technique: Routine CT of the chest, abdomen and pelvis without IV  contrast. AEC (automated exposure control) and/or manual techniques such as size-specific kV and mAs are employed where appropriate to reduce radiation exposure for all CT exams.  Comparison: CT chest, 05/02/2024.  Findings: ABSENCE OF INTRAVENOUS CONTRAST DECREASES SENSITIVITY FOR DETECTION OF FOCAL LESIONS AND VASCULAR PATHOLOGY.  LUNGS/PLEURA: A small right-sided pneumothorax is seen. Mild bilateral dependent lower lobe atelectasis. No large dense confluent focal consolidation or mass within the lungs is seen. The central airway is clear.  HEART: The heart is top normal in size. A trace amount of  pericardial fluid is seen anteriorly. Severe multivessel coronary artery calcifications.  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  MEDIASTINUM: Evaluation of the hila is limited without contrast. Otherwise, no overt mediastinal lymphadenopathy by noncontrast CT.  LIVER:  No definite acute abnormality.  BILIARY: No radiopaque stones or sludge. No evidence of cholecystitis. No overt biliary ductal dilation.  PANCREAS:  No definite acute abnormality.  SPLEEN:  No splenomegaly.  ADRENALS:  Mild diffuse left adrenal gland thickening. Normal right adrenal gland.  KIDNEYS/URETERS:  A few left-sided renal cysts are seen measuring up to 28 mm at the anterior mid to lower pole without overt suspicious features by noncontrast CT, for which no specific renal follow-up imaging is considered necessary. No renal stone nor hydronephrosis.  BOWEL:  Evaluation is limited without IV and oral contrast. No overt signs of bowel obstruction. Normal appendix.  VASCULAR: Evaluation is limited without contrast.  Moderate to severe abdominal aortoiliac atherosclerosis, without aneurysm.  LYMPH NODES:  No lymphadenopathy.  PERITONEUM: No overt free air, free fluid, nor abscess.  PELVIC ORGANS:  Moderate prostatomegaly with coarse prostatic calcifications. The urinary bladder is underdistended and therefore not well assessed. Apparent mild diffuse urinary bladder wall thickening is seen.  BONES:  An acute fracture through the calcified right anterior fifth costochondral cartilage is seen (2:77). Moderate to severe multilevel degenerative disc disease of the thoracic and lumbar spine predominantly at L5-S1. Moderate degenerative changes of the bilateral hips. Evaluation for fractures through the bilateral iliac bones is degraded due to patient motion artifacts.  SOFT TISSUES: Diffuse subcutaneous  emphysema is seen within the right anterolateral chest wall soft tissues.    1.    Small right-sided pneumothorax associated with an acute fracture through the calcified right anterior fifth costochondral cartilage with overlying diffuse subcutaneous emphysema within the right anterolateral chest wall. 2.    No noncontrast CT evidence of an acute abnormality is identified in the abdomen and pelvis. 3.    Severe multivessel coronary artery calcifications. 4.    Fusiform aneurysmal dilatation of the ascending thoracic aorta measuring up to 48 mm at the mid segment, not significantly changed given differences in technique. Recommend vascular and imaging follow-up. 5.    Moderate prostatomegaly. Recommend correlation with PSA. 6.    Apparent mild diffuse urinary bladder wall thickening, which may be due to underdistention, however recommend correlation with urinalysis to exclude cystitis.  A message alerting Dr. JEOFFREY MURRAY NODAL of these results was sent using the [employer] secure chat message system on 06/15/2024 4:18 PM.  Signed (Electronic Signature): 06/15/2024 4:31 PM Signed By: Donnice Cerise, MD  CT Head Wo Contrast Result Date: 06/15/2024 Exam:  CT Head and Cervical Spine without Contrast  History:  Trauma status post fall.  Technique: Routine head and cervical spine CT without IV contrast. AEC (automated exposure control) and/or  manual techniques such as size-specific kV and mAs are employed where appropriate to reduce radiation exposure for all CT exams.  Comparison:  CT head and cervical spine 05/02/2024  Findings:  CT HEAD:  BRAIN: Motion degraded exam, limiting evaluation of anterior frontal lobes. No significant change prior CT. No CT evidence of acute infarction, hemorrhage, edema, mass, or mass effect. Ventricles and basilar cisterns are unremarkable.  SOFT TISSUES:  Negative.  CALVARIUM:  No fracture. No suspicious lesions.  SINUSES AND MASTOIDS:  No mucosal thickening or fluid.   CT CERVICAL SPINE:   CERVICAL AND UPPER THORACIC SPINE:  Straightening of normal cervical lordosis. No acute fracture or traumatic subluxation. Degenerative minimal grade 1 anterolisthesis C4 on C5, unchanged. Severe disc degeneration C5-C6 and moderate disc degeneration C6-C7. No high-grade spinal canal stenosis. Multilevel facet arthrosis, left greater than right.  NECK SOFT TISSUES:  Negative. No prevertebral edema.  LUNG APICES AND SUPERIOR MEDIASTINUM:  Negative.    CT HEAD: 1.    Motion degraded exam. No CT evidence of acute intracranial abnormality within limitations.  CT CERVICAL SPINE: 1.    No CT evidence of acute injury in the cervical spine. 2.    Moderate to severe cervical disc degeneration as above.  Signed (Electronic Signature): 06/15/2024 4:12 PM Signed By: Bernardino Drones, MD  CT Cervical Spine Wo Contrast Result Date: 06/15/2024 Exam:  CT Head and Cervical Spine without Contrast  History:  Trauma status post fall.  Technique: Routine head and cervical spine CT without IV contrast. AEC (automated exposure control) and/or manual techniques such as size-specific kV and mAs are employed where appropriate to reduce radiation exposure for all CT exams.  Comparison:  CT head and cervical spine 05/02/2024  Findings:  CT HEAD:  BRAIN: Motion degraded exam, limiting evaluation of anterior frontal lobes. No significant change prior CT. No CT evidence of acute infarction, hemorrhage, edema, mass, or mass effect. Ventricles and basilar cisterns are unremarkable.  SOFT TISSUES:  Negative.  CALVARIUM:  No fracture. No suspicious lesions.  SINUSES AND MASTOIDS:  No mucosal thickening or fluid.   CT CERVICAL SPINE:  CERVICAL AND UPPER THORACIC SPINE:  Straightening of normal cervical lordosis. No acute fracture or traumatic subluxation. Degenerative minimal grade 1 anterolisthesis C4 on C5, unchanged. Severe disc degeneration C5-C6 and moderate disc degeneration C6-C7. No high-grade spinal canal stenosis. Multilevel facet arthrosis,  left greater than right.  NECK SOFT TISSUES:  Negative. No prevertebral edema.  LUNG APICES AND SUPERIOR MEDIASTINUM:  Negative.    CT HEAD: 1.    Motion degraded exam. No CT evidence of acute intracranial abnormality within limitations.  CT CERVICAL SPINE: 1.    No CT evidence of acute injury in the cervical spine. 2.    Moderate to severe cervical disc degeneration as above.  Signed (Electronic Signature): 06/15/2024 4:12 PM Signed By: Bernardino Drones, MD  XR Chest Portable Result Date: 06/15/2024 Exam:  Portable Chest  History:  Chest wall injury  Technique:  Single frontal view.  Comparison:  CT chest 05/02/2024  Findings:   The heart size, mediastinal contours, pleural surfaces and pulmonary vascularity are all normal. The lungs are clear. There is no detectable pleural effusion or pneumothorax. The regional bones are normal for age.    Negative portable chest xray exam.    Signed (Electronic Signature): 06/15/2024 3:44 PM Signed By: Kristopher Tantillo, MD   Medications Administered:  Medications  HYDROmorphone  (DILAUDID ) injection 1 mg (1 mg Intravenous Given 06/15/24 1530)  HYDROmorphone  (DILAUDID ) injection  1 mg (1 mg Intravenous Given 06/15/24 1545)  LORazepam (ATIVAN) injection 1 mg (1 mg Intravenous Given 06/15/24 1701)    Discharge Medications (Medications Prescribed during this  ED visit and Patient's Home Medications) :    Your Medication List     ASK your doctor about these medications    albuterol  90 mcg/actuation inhaler Commonly known as: PROVENTIL  HFA;VENTOLIN  HFA albuterol  sulfate HFA 90 mcg/actuation aerosol inhaler  inhale every 6 hrs   alfuzosin  10 mg 24 hr tablet Commonly known as: UROXATRAL  Take 1 tablet (10 mg total) by mouth Medrol Dose Pack Scheduling ONLY.   ALPRAZolam  1 MG tablet Commonly known as: XANAX  Take 1 tablet (1 mg total) by mouth Three (3) times a day as needed.   amiodarone 400 MG tablet Commonly known as: PACERONE Take 1 tablet (400 mg total)  by mouth in the morning and 1 tablet (400 mg total) in the evening. Take with meals.   apixaban 5 mg Tab Commonly known as: ELIQUIS Take 1 tablet (5 mg total) by mouth two (2) times a day.   carbidopa -levodopa  25-100 mg per tablet Commonly known as: SINEMET  Take 1 tablet by mouth every three (3) hours.   divalproex ER 500 MG extended released 24 hr tablet Commonly known as: DEPAKOTE ER Take 1 tablet (500 mg total) by mouth two (2) times a day.   finasteride  5 mg tablet Commonly known as: PROSCAR  Take 1 tablet (5 mg total) by mouth daily.   fludrocortisone 0.1 mg tablet Commonly known as: FLORINEF Take 1 tablet (0.1 mg total) by mouth two (2) times a day.   midodrine  10 MG tablet Commonly known as: PROAMATINE  Take 1 tablet (10 mg total) by mouth Three (3) times a day.   rosuvastatin 20 MG tablet Commonly known as: CRESTOR Take 1 tablet (20 mg total) by mouth daily.          Hunter Jeoffrey Murray, [place] 06/15/24 2105

## 2024-06-17 NOTE — Consults (Signed)
 ------------------------------------------------------------------------------- Attestation signed by Reche Almarie Molt, MD at 06/18/2024 10:22 AM I provided general supervision. I did not personally evaluate patient.   Billing per Vernell Bridges, PA-C.  -------------------------------------------------------------------------------  GERIATRICS CONSULT NOTE  Admission Date: 06/15/2024  Admission Status: Admission Status: Inpatient  Chief Complaint: GLF  Assessment & Recommendations   Michael Fitzgerald is a 73 y.o. male w/ a sPMHx of AAA, A fib, HLD, Orthostatic HTN, OSA, Tobacco Use, Insomnia, RLS, H/o sleepwalking and violent dream enactment, Parkinson's Dz, Anxiety, H/o Multiple falls, Chronic anemia, BPH, and Severe Constipation, who sustained Right pneumothorax s/p chest tube and Right 5th rib fx, due to GLF. On fall work up pt was found to have a complicated UTI and has started tx w/ 2g rocephin given in the ED and 7 day course of bactrim started on 7/30. Pt also noted to be violent w/ nurses and sitter requiring soft wrist restraints. Per bedside report his agitation has improved with started PD medications.   # Orthostatic Hypertension - pt reports he self discontinued florinef 0.1mg  daily and midodrine  10mg  TID - difficulty working w/ PT during this admission d/t dizziness - s/p 500ml fluid bolus today  - TTE pending today, vitals q4hrs, strict I&Os have been ordered  # PD - encephalopathy improved w/ restarting sinemet , pt is out of restraints, sitter at bedside   - pt reports he is no longer followed by outside neurologist  # Underweight w/ BMI @ 20 - sitter notes decreased PO intake for fluids and solids - regular diet ordered  # Severe Constipation  - tx'd w/ miralax and enemas at home, recently started linzess   - receiving senna 8.6mg  during this admission # Urinary retention - foley in place # Incidental Findings:  - Moderate prostatomegaly (noted on CT).  Recommend correlation with PSA.   Consider updating BMP and CBC d/t dizziness  Consider compression stockings (preferably thigh high if available)  Midodrine  is considered a first line agent in patients w/ PD, pt may benefit from a trial of midodrine  w/ close BP monitoring if nonpharmacologic treatments do not improvement orthostatic hypotension  Consider offering ensure plus/ensure w/ meals Consider escalating bowel regimen to include miralax, consider enema if no BM today, improving constipation may improve urinary retention   He should continue close follow up with his GI doctor once discharged  At this point patient does not show si/sx of delirium.  Continue routine practices to address potential delirium: early mobilization, frequent reorientation, promotion of normal day/ night cycle.  Encourage pt to find a neurologist he can drive to, he will need close follow up for PD and orthostatic hypotension Encourage close follow up with PCP for PSA   ASSESSMENT:   Mentation: Risk factors for delirium:   High Risk Multi-Morbidities  Anemia Poor Nutrition Poor Functional Status Immobilization  Advanced Age Constipation  SPMSQ score is 8, indicating No cognitive impairment.   CAM-S Score:   Acute Onset and Fluctuating Course - Yes (1) Inattention - No (0) Disorganized Thinking - No (0) Altered Level of Consciousness - Mild: vigilant or lethargic (1) Severity Score (Higher scores indicate more severe delirium) = 2.  Patient has previous history of cognitive impairment.  Patient has symptoms concerning for depression.   Mobility:  Functional Status PTA Per patient he lives at home with his wife.  Prior to his injury he was able to manage his own meds and cook.  Patient states he no longer drives.  AM-PAC score  AM-PAC ADL: AM-PAC ADL:  AM-PAC 6-Clicks - Basic Mobility - How much help is needed from another person when: Turning from your back to your side while in a flat bed  without using bedrails?: A little Moving from lying on your back to sitting on the side of a flat bed without using bedrails?: A lot Moving to and from a bed to a chair (including a wheelchair)?: Total Assist Standing up from a chair using your arms (e.g, wheelchair, or bedside chair)?: A lot To walk in hospital room?: Total Assist Climbing 3-5 steps with a railing?: Total Assist Raw Score: 10   AM-PAC Mobility: AM-PAC Mobility:  AM-PAC 6-Clicks - Basic Mobility - How much help is needed from another person when: Turning from your back to your side while in a flat bed without using bedrails?: A little Moving from lying on your back to sitting on the side of a flat bed without using bedrails?: A lot Moving to and from a bed to a chair (including a wheelchair)?: Total Assist Standing up from a chair using your arms (e.g, wheelchair, or bedside chair)?: A lot To walk in hospital room?: Total Assist Climbing 3-5 steps with a railing?: Total Assist Raw Score: 10   Medications:  Current hospital medications:    Current Facility-Administered Medications:  .  acetaminophen  (TYLENOL ) tablet 325 mg .  atorvastatin (LIPITOR) tablet 80 mg .  carbidopa -levodopa  (SINEMET  CR) 25-100 mg per CR tablet 1 tablet .  dextrose  (D50W) 50 % injection 12.5 g .  dextrose  (TRUEPLUS GLUCOSE) 15 gram/32 mL oral gel 15 g .  enoxaparin (LOVENOX) syringe 40 mg .  HYDROcodone -acetaminophen  (NORCO) 5-325 mg per tablet 1 tablet **OR** HYDROcodone -acetaminophen  (NORCO) 5-325 mg per tablet 2 tablet .  HYDROmorphone  (DILAUDID ) 1 mg/mL injection 0.2 mg .  melatonin tablet 6 mg .  polyethylene glycol (GLYCOLAX) packet 17 g .  senna (SENOKOT) tablet 8.6 mg .  sulfamethoxazole-trimethoprim (BACTRIM DS) 800-160 mg per tablet 1 tablet   Home Medications Home Medications     Med List Status: Pharm Tech complete Set By: Lorn Nephew, CPhT at 06/16/2024  3:21 PM    Status Comment       06/16/2024  3:32 PM    FYI- Spouse  stated she takes med for High BP and Spouse takes med for low BP; however Pharmacist disagreed and stated he is not taking anything for his BP.          * ALPRAZolam  (XANAX ) 1 mg tablet   * carbidopa -levodopa  (SINEMET  CR) 25-100 mg per CR tablet   * cyanocobalamin /cobamamide (B12 SL)   * linaCLOtide  (LINZESS ) 290 mcg cap capsule   * omega 3-dha-epa-fish oil (OMEGA 3) 1,000 mg DR capsule   * potassium gluconate 595 mg (99 mg) tablet   * rosuvastatin (CRESTOR) 20 mg tablet   * tadalafiL (Cialis) 5 mg tablet   * tiZANidine (ZANAFLEX) 2 mg capsule        Allergies: Latex, Gabapentin, Prednisone, Pregabalin , and Sildenafil citrate   Multi-complexity:    Past Medical History List:    has a past medical history of History of depression, History of hepatitis, Latex allergy, Neoplasm of uncertain behavior of parotid gland, Obstructive sleep apnea, Parkinson's disease    (CMD), Peripheral nerve facial nerve paralysis, and Tremor.   A fib (pt denies this diagnosis) AAA @ 48mm HLD Home med includes rosuvastatin 20mg  daily             Orthostatic HTN OSA does not wear CPAP Tobacco Use currently  smoking 1pp, states he has been smoking for 60yrs Insomnia Home med includes alprazolam  1mg  at bedtime  RLS H/o Sleepwalking and violent dream enactment  Parkinson's Dz w/ h/o hallucinations Home med includes Sinemet  CR 25-100mg , 2 tabs every 6hrs, and B12 SL,  Anxiety H/o Multiple Falls Chronic anemia baseline Hgb 11 BPH Constipation requires enema for BM and uses miralax daily, s/p eval by outside GI in 05/2024, plan to follow up in 1 month (started Linzess  in early July)  Matters Most:   His wife and his dog  Advance Care Planning  DNAR / Limited SOTO  Extended Emergency Contact Information Primary Emergency Contact: Aloi,Debra Address: [address]          [place], [place] [address] [place] Home Phone: [phone] Relation: Spouse Secondary Emergency Contact:  Gotts,Alex Mobile Phone: [phone] Relation: Son    Subjective:   HPI  Michael Fitzgerald is a 73 y.o. male w/ a sPMHx of A fib, AAA, HLD, Orthostatic HTN, OSA, Tobacco Use, Insomnia, RLS, H/o sleepwalking and violent dream enactment, Parkinson's Dz, Anxiety, H/o Multiple falls, Chronic anemia, BPH, and Severe Constipation, who sustained Right pneumothorax s/p chest tube and Right 5th rib fx, due to GLF. On fall work up pt was found to have a complicated UTI and has started tx w/ 2g rocephin given in the ED and 7 day course of bactrim started on 7/30. Pt also noted to be violent w/ nurses and sitter requiring soft wrist restraints. Per bedside report his agitation has improved with started PD medications.   Patient is sitting up in bed and watching TV when I entered his room.  Patient does not seem to have a very clear memory of his fall but states that he falls frequently.  He notes that every few weeks he has episodes of dizziness and this has led to falls in the past.  EMR review indicates he was evaluated at an outside ED due to syncope in 04/2024.  Patient states he self discontinued medicines for orthostatic hypotension because they tend to cause his blood pressure to go up too high.    Patient endorses 17 pound weight loss this year.  He states he is able to cook his own meals but just does not have the appetite to eat food.  He is currently being followed by gastroenterologist due to his weight loss and severe constipation.  His gastroenterologist has recommended a colonoscopy.  Patient states he was not able to get the colonoscopy because the anesthesiologist felt that it was too risky due to his abdominal aortic aneurysm.  Patient has a a long history of Parkinson's disease.  He has been followed by a neurologist in the past but decided to stop going due to to the long drive.  He states his PCP currently prescribes his Parkinson's medicines.  Record review indicates his last neurology  visit was in 2023 and patient was undergoing a DBS workup.  Patient notes as long as he takes his medicines his symptoms are fairly well-controlled.  He does have associated hallucinations and sleep disorders.  Patient has a long history of smoking tobacco.  He states he is currently smoking 1 pack a day and has smoked for approximately 58 years.  Patient states he is never been told he has COPD and does not currently use inhalers.  There are no PFTs to review at the time of writing this note.   Review of Systems:   Review of Systems  Constitutional: Positive  for decreased appetite and weight loss.  Cardiovascular:  Positive for near-syncope. Negative for chest pain.  Respiratory:  Negative for shortness of breath.   Musculoskeletal:  Positive for falls.  Gastrointestinal:  Positive for constipation.  Genitourinary:  Positive for incomplete emptying.  Neurological:  Positive for dizziness and weakness.  Psychiatric/Behavioral:  Positive for altered mental status and hallucinations.       Social History  Social Drivers of Health   Living Situation: Low Risk  (05/16/2023)   Received from [facility]   . Within the past 12 months, have you ever stayed: outside, in a car, in a tent, in an overnight shelter, or temporarily in someone else's home(i.e.couch-surfing)?: No   . Are you worried about losing your housing?: No  Food Insecurity: No Food Insecurity (05/16/2023)   Received from [facility]   Food vital sign   . Within the past 12 months, you worried that your food would run out before you got money to buy more: Never true   . Within the past 12 months, the food you bought just didn't last and you didn't have money to get more: Never true  Transportation Needs: No Transportation Needs (05/16/2023)   Received from [facility]   [facility] - Transportation   . Lack of Transportation (Medical): No   . Lack of Transportation (Non-Medical): No  Utilities: Low Risk   (05/16/2023)   Received from [facility]   Utilities   . Within the past 12 months, have you been unable to get utilities(heat, electricity) when it was really needed?: No  Alcohol  Screening: Not At Risk (05/17/2023)   Received from [facility]   Alcohol  Use   . How often do you have a drink containing alcohol ?: Never   . How many drinks containing alcohol  do you have on a typical day when you are drinking?: Not on file   . How often do you have 5 or more drinks on one occasion? : Never  Tobacco Use: High Risk (05/19/2024)   Received from [facility]   Patient History   . Smoking Tobacco Use: Every Day   . Smokeless Tobacco Use: Never   . Passive Exposure: Not on file  Depression: Not at risk (10/04/2022)   Received from [facility] visits prior to 01/18/2023.   PHQ-2   . SDOH PHQ2 SCORE: 0      Family History  family history includes Arthritis in his mother; Cancer in his brother, mother, and sister; Depression in his mother and sister; Diabetes in his sister; Heart disease in his father, mother, and sister; Hypertension in his mother and sister; Seizures in his mother; Sleep disorder in his mother and sister.    Objective:    BP 127/77 (BP Location: Left arm, Patient Position: Lying)   Pulse 71   Temp 98.6 F (37 C) (Oral)   Resp 19   Ht 1.727 m (5' 8)   Wt 61.2 kg (135 lb)   SpO2 97%   BMI 20.53 kg/m    Physical Exam:  Physical Exam Constitutional:      General: He is awake.     Appearance: He is underweight.     Interventions: Cervical collar in place.   Cardiovascular:     Rate and Rhythm: Normal rate and regular rhythm.     Heart sounds: No murmur heard. Pulmonary:     Effort: Pulmonary effort is normal.  Breath sounds: No wheezing.  Abdominal:     General: Abdomen is flat.     Tenderness: There is no guarding.   Neurological:     Mental Status: He is alert and oriented to person, place, and time.   Psychiatric:         Mood and Affect: Mood normal.        Behavior: Behavior normal. Behavior is cooperative.       Short Portable Mental Status Questionnaire (SPMSQ):  What is the date, month, and year? correct What is the day of the week? incorrect What is the name of this place? correct What is your phone number? correct How old are you? correct When were you born? correct Who is the current president? correct Who was the previous president? incorrect What is your mother's maiden name? correct Can you count backward from 20 by 3's? correct Score: 8/10  Scoring for SPMSQ: 0-2 errors:  normal mental functioning *One more error is allowed in the scoring if a patient has a grade school education or less.  *One less error is allowed if the patient has had a education beyond high school level.    Labs and Imaging:  Lab Results  Component Value Date   WBC 8.80 06/15/2024   HGB 12.6 (L) 06/15/2024   HCT 36.4 (L) 06/15/2024   MCV 93.1 06/15/2024   PLT 181 06/15/2024   Lab Results  Component Value Date   GLUCOSE 87 06/15/2024   CALCIUM 7.7 (L) 06/15/2024   NA 139 06/15/2024   K 4.6 06/15/2024   CO2 25 06/15/2024   CL 110 (H) 06/15/2024   BUN 14 06/15/2024   CREATININE 0.57 (L) 06/15/2024   Lab Results  Component Value Date   ALT 3 (L) 06/15/2024   AST 11 (L) 06/15/2024   GGT 6 (L) 06/15/2024   BILITOT 0.5 06/15/2024     XR Chest 1 View Result Date: 06/17/2024 New area of hyperlucency below left cardiac border, favors bowel loop below diaphragm. Consider lateral x-ray for further evaluation if clinical concern.   CT Spine Thoracic WO Contrast Result Date: 06/16/2024 1.  No evidence of acute fracture or traumatic malalignment of the thoracic spine.  2. Small right apical pneumothorax.  3. Right lower lobe consolidative airspace disease which may reflect atelectasis or aspiration.  4. Incompletely characterized low-attenuation lesions in the left kidney. May represent cysts but advise  outpatient ultrasound to further evaluate. These findings were discussed with Dr. Ronna by Dr. Dorice via telephone on 06/16/2024 at 1700.  XR Chest 1 View Result Date: 06/16/2024 Improved currently trace right pneumothorax with interval right-sided chest tube placement.   CT Abdomen Pelvis WO Contrast Result Date: 06/15/2024 1.    Small right-sided pneumothorax associated with an acute fracture through the calcified right anterior fifth costochondral cartilage with overlying diffuse subcutaneous emphysema within the right anterolateral chest wall.  2.    No noncontrast CT evidence of an acute abnormality is identified in the abdomen and pelvis.  3.    Severe multivessel coronary artery calcifications.  4.    Fusiform aneurysmal dilatation of the ascending thoracic aorta measuring up to 48 mm at the mid segment, not significantly changed given differences in technique. Recommend vascular and imaging follow-up.  5.    Moderate prostatomegaly. Recommend correlation with PSA.  6.    Apparent mild diffuse urinary bladder wall thickening, which may be due to underdistention, however recommend correlation with urinalysis to exclude cystitis. A message alerting Dr.  AMBER KAY NODAL of these results was sent using the EPIC secure chat message system on 06/15/2024 4:18 PM. Signed (Electronic Signature): 06/15/2024 4:31 PM Signed By: Donnice Cerise, MD  CT Spine Cervical WO Contrast Result Date: 06/15/2024 CT HEAD:  1.    Motion degraded exam. No CT evidence of acute intracranial abnormality within limitations.  CT CERVICAL SPINE:  1.    No CT evidence of acute injury in the cervical spine.  2.    Moderate to severe cervical disc degeneration as above. Signed (Electronic Signature): 06/15/2024 4:12 PM Signed By: Bernardino Drones, MD   Encounter Date: 06/15/24  ECG 12 lead   Narrative   Ventricular Rate                   91        BPM                  Atrial Rate                        91        BPM                   P-R Interval                       156       ms                   QRS Duration                       86        ms                   Q-T Interval                       382       ms                   QTC Calculation Bazett             469       ms                   Calculated P Axis                  53        degrees              Calculated R Axis                  -62       degrees              Calculated T Axis                  35        degrees               Sinus rhythm Nonspecific T wave abnormality  No previous ECGs available Confirmed by Kingston Novel  26  on 06-17-2024 12 03 18 PM    Reviewed: active problem list, medication list, allergies, social history, notes from last encounter, lab results, imaging   Follow-up: Appreciate the opportunity to consult in the care of this patient, will continue to follow electronically and re-engage as needed.  Communication of  Consult Recommendations: Assessment and recommendations communicated to primary service  I have personally seen and performed a physical examination on patient Arkel Cartwright.  I spent 87 minutes preparing to see the patient by reviewing labs and imaging, nursing documentation from overnight, performing physical exam, reviewing outside records, discussion and care coordination with the multidisciplinary team, independently interpreting and communicating lab and imaging results with patient and family, including discussion of plans for initiating the discharge or post-hospital care, optimization of acute and chronic medical problems, and optimization of mobility to further prevent weakness and deconditioning.   Electronically signed by: Vernell Earnie Bridges, PA-C 06/17/2024 12:35 PM *Some images could not be shown.

## 2024-06-20 NOTE — Progress Notes (Signed)
 Trauma Surgery Progress Note  LOS: 4   Mechanism of Injury: -  GLF  Injury List:  Right pneumothorax R 5th rib frcture  Consultants:  None  Subjective: 24 Hour Events:  No significant change in pt's condition since yesterday as no acute events reported overnight, hemodynamically stable, and afebrile. The patient is tolerating their current diet without nausea or vomiting. Making adequate urine output, voiding spontaneously; noted without any supplemental oxygen; PT/OT continues to work with patient.  Anticipate home when medically ready. Pain is well controlled on current oral regimen.     Objective: Vital Signs: Temp:  [97.5 F (36.4 C)-98.5 F (36.9 C)] 97.5 F (36.4 C) Heart Rate:  [66-76] 68 Resp:  [16-18] 18 BP: (93-138)/(62-88) 109/74  Current Weight: Weight: 61.2 kg (135 lb)   O2 Device: O2 Device: None (Room air) O2 Flow Rate (L/min): 2 L/min  Ins/Outs:  Intake/Output Summary (Last 24 hours) at 06/20/2024 0722 Last data filed at 06/20/2024 0606 Gross per 24 hour  Intake 900 ml  Output 1145 ml  Net -245 ml     Physical Exam: General:  NAD. Resting in bed with eyes closed but easily awake.  Neurological: GCS = 14/15. Pleasantly confused, cooperative. Safety sitter at bedside.  HEENT: PERRL.  EOM's intact. Cardiovascular: Regular rate and rhythm. Well perfused. Peripheral pulses palpable x 4.  Respiratory: Respirations even and unlabored. No accessory muscle use noted. Clear to auscultation throughout with equal chest rise & fall. No resp distress or SOB noted. No supplemental O2 noted. GU: No Foley noted Extremities: MOE x 4 without difficulty and spontaneously. Able to wiggle all fingers / toes without difficulty, warm to touch with rapid cap refill.  Skin / Wound: Warm & dry.         Incentive Spirometry Volume: 2000  Pertinent Labs in the Last 24 Hours: See results tab  Pertinent Diagnostic Studies in the Last 24 Hours: See imaging  tab  Assessment/Plan: Current Consultant Recommendations:   Geriatric trauma: 7/31 Assessment & Recommendations   Michael Fitzgerald is a 73 y.o. male w/ a sPMHx of AAA, A fib, HLD, Orthostatic HTN, OSA, Tobacco Use, Insomnia, RLS, H/o sleepwalking and violent dream enactment, Parkinson's Dz, Anxiety, H/o Multiple falls, Chronic anemia, BPH, and Severe Constipation, who sustained Right pneumothorax s/p chest tube and Right 5th rib fx, due to GLF. On fall work up pt was found to have a complicated UTI and has started tx w/ 2g rocephin given in the ED and 7 day course of bactrim started on 7/30. Pt also noted to be violent w/ nurses and sitter requiring soft wrist restraints. Per bedside report his agitation has improved with started PD medications.    # Orthostatic Hypertension - pt reports he self discontinued florinef 0.1mg  daily and midodrine  10mg  TID - difficulty working w/ PT during this admission d/t dizziness - s/p 500ml fluid bolus today  - TTE pending today, vitals q4hrs, strict I&Os have been ordered  # PD - encephalopathy improved w/ restarting sinemet , pt is out of restraints, sitter at bedside   - pt reports he is no longer followed by outside neurologist  # Underweight w/ BMI @ 20 - sitter notes decreased PO intake for fluids and solids - regular diet ordered  # Severe Constipation  - tx'd w/ miralax and enemas at home, recently started linzess   - receiving senna 8.6mg  during this admission # Urinary retention - foley in place # Incidental Findings:  - Moderate prostatomegaly (noted on CT).  Recommend correlation with PSA.    Consider updating BMP and CBC d/t dizziness  Consider compression stockings (preferably thigh high if available)  Midodrine  is considered a first line agent in patients w/ PD, pt may benefit from a trial of midodrine  w/ close BP monitoring if nonpharmacologic treatments do not improvement orthostatic hypotension  Consider offering ensure plus/ensure  w/ meals Consider escalating bowel regimen to include miralax, consider enema if no BM today, improving constipation may improve urinary retention   He should continue close follow up with his GI doctor once discharged  At this point patient does not show si/sx of delirium.  Continue routine practices to address potential delirium: early mobilization, frequent reorientation, promotion of normal day/ night cycle.  Encourage pt to find a neurologist he can drive to, he will need close follow up for PD and orthostatic hypotension Encourage close follow up with PCP for PSA    Neuro:  GCS: 14/15  - Monitor neuro exam closely  Spine cleared without restrictions per Trauma.   Pain control - cont sche tylenol   - cont PRN norco   Acute delirium: Resolved --dc safety sitter dc 8/1  Parkinson's Disease - cont home Carbidopa -levodopa    Hx: anxiety - home xanax  not currently resumed - hx of consistent fills    Cardiovascular: Hx: AAA, Afib, multiple falls  Syncope work up - EKG with NSR - Echo with small pericardial effusion; discussed with attending; CTM 8/1  HLD - cont home Atorvastatin   Orthostatic hypotension 7/31 and 8/1 - 500 cc LR bolus x1 -stable labs   Fluid Volume Status:   Intake/Output Summary (Last 24 hours) at 06/20/2024 9277 Last data filed at 06/20/2024 0606 Gross per 24 hour  Intake 900 ml  Output 1145 ml  Net -245 ml     Pulmonary: Acute respiratory insufficiency secondary to injuries present on admission due to trauma. -- R 5th rib fx -- R ptx s/p ctube 7/30 -- AM CXR reviewed/discussed with attending -- water sea; 7/31 --chest tube removed 8/1 -- post pull chest xrays stable without PTX -- Chest x-ray reviewed independently and with attending  Encourage pulmonary toilet with IS and & OOB    -- RT cont to follow     GI:  Constipation concerns due to decreased activity and narcotic pain medication use  -- cont current bowel regimen -- last BM:  08/01 -- cont scheduled Miralax & Senna   -- cont PRN Dulcolax supp, Fleets, & MOM     Heme/ID: Heme: Acute blood loss anemia secondary to injuries present on admission due to trauma. No current clinical indications for transfusion @ this time. Will cont to monitor.   Results from last 7 days  Lab Units 06/18/24 1029 06/15/24 2308  HEMOGLOBIN g/dL 88.5* 87.3*    Lovenox & SCDs for DVT ppx  -- Lovenox dose not weight based due to age > 50 y.o  ID:   Complicated UTI on admission  - 1 dose of Rocephin given in ED - 7 day bactrim course starting 7/30   F/E/N: F: SL  E: Replete as indicated   N: Regular   Endocrine: No current issues No condition currently present   Renal: Adequate UO Current creatinine WNL   Acute urinary retention - foley in place  --Foley discontinued August 2 - Now voiding adequate urinary output   MSK: R rib fx - managed per trauma, see above for POC   Dispo: PT/OT: anticipates home with 24/7  CM assisting w/  dispo planning  Geri trauma consulted 7/31   Incidental Findings: - Will be addressed at time of discharge -Low attenuation lesions in the left kidney may represent cysts but outpatient ultrasound to further evaluate is advised.   Patient DailyTrauma Checklist   Spine Cleared: Yes, and is appropriate order entered? Yes  DVT Prophylaxis: Yes, and is the dose appropriate? Yes    Antibiotics Currently Ordered: Yes, ordered on 7/30. Suspected source/reason antibiotics ordered: .UTA. Able to de-escalate? No Has an end date been established for antibiotics? Yes    Nutrition Adequate: Yes Nutritional intake: PO Ongoing swallow studies: No  Wound Care Needs: No  Discharge Planning Discharge Disposition:Home Awaiting therapy clearance: Yes and pending medical readiness Transfer/Discharge Status: TBD  I have personally provided service for this patient. Assessment and plan of care discussed with Dr. Gladis (trauma attending) under  direct supervision.

## 2024-06-20 NOTE — Progress Notes (Signed)
 Acute Occupational Therapy Treatment  Visit Count: OT Visit Count: 3  Precautions: Other Therapy Precautions: Fall risk Bracing: Spinal Precautions - No Restrictions Comment: (+) orthostatic hypotension  Medical Diagnosis/Course: OT Diagnosis/Course Pertinent Medical Course: 73 yo male 7/29 after GLF. (+) R small PTX, R 5th rib fx, found to have complicated UTI. 7/30 s/p chest tube insertion. PMH: parkinson's, AAA, afib, multiple falls    Assessment and Plan  Assessment: Patient demonstrates steady progress towards goals with AM-PAC score of AM-PAC Total Score: 20 . Score remains the same due to no significant changes in performance. Score indicates a 38.32% impairment.  Upon therapist arrival to room, pt seated in bed and agreeable to participate in therapy session. Pt completed STS from EOB with supervision and ambulated using RW to bathroom with CGA. Pt transitioned on/off toilet in bathroom with SBA-supervision. Pt ambulated from bathroom>chair using RW with CGA and ambulated ~10-15 feet in room using RW with CGA. Pt completed grooming task (washing face) seated in chair with setup A. Pt transferred from chair>EOB with SBA and transitioned to supine position with supervision.   Problem List:  Impairments/Limitations: Activity Tolerance Deficits, Balance Deficits, Basic Activity of Daily living deficits, Gross Motor deficits, IADL deficits, Muscle weakness, Mobility deficits, Cognitive Deficits, Coordination Deficits  OT Recommendation: Recommendation OT Recommendation:  (hopeful to progress to d/c home with initial increased S/A and HHOT pending vital stability; however, if increased S/A is not available, may benefit from Rehabilitation Facility) OT Equipment Recommended: None  See OT note or addendum for specific discharge needs.  Home Living Environment: Type of Home House  Home Layout One level, Ramped entry  Exterior Stairs - Veterinary surgeon - number 5 steps at front  door; has ramp access at back door  [employer] - Psychologist, sport and exercise - number    Shower/Tub Tub/Shower Unit  [employer] in shower, Paediatric nurse, Grab bars around toilet, Tax adviser, Raised toilet height  Psychologist, clinical, [employer], Wheelchair-electric, Cane-single point, Hospital bed  Equipment Currently Using depending on how pt felt or how dizzy he felt determined device he would use for mobility, if feeling really dizzy used electric wheelchair, a little dizzy used rollator walker, no dizziness used cane  Additional Comments     Prior Level of Function:  Level of Independence Requiring assistance  Lives With Alone (reports his wife lives in the house next door, they have had separate houses as she previously worked 3rd shift and he worked 1st shift)  Person(s) able to assist at d/c wife can provide increased supervision, per pt wife limited physical assist because she needs a knee replacement; son is nearby and can provide PRN assist/supervision when not working  Patient Environmental health practitioner, Meal Preparation, Yard work, Copywriter, advertising ADLs, Manage Medications, Home management, Social Participation  Requires Assist With  Home Management (wife assists as needed)   OT Goals: Encounter Problems     Encounter Problems (Active)     Occupational Therapy     Patient will perform bathing with distant supervision with shower chair     Start:  06/18/24    Expected End:  07/18/24         Patient will perform grooming Independently     Start:  06/18/24    Expected End:  07/18/24         Patient will perform upper body dressing Independently     Start:  06/18/24  Expected End:  07/18/24         Patient will perform lower body dressing with distant supervision     Start:  06/18/24    Expected End:  07/18/24         Patient will perform toileting Independently     Start:  06/18/24    Expected  End:  07/18/24             Patient/caregiver will be able to verbalize 3 fall prevention strategies to decrease risk of falls during functional mobility     Start:  06/18/24    Expected End:  07/18/24         Patient's ADLs Inpatient Short Form score will increase to 21/24 to indicate improved function.      Start:  06/18/24    Expected End:  07/18/24         Patient will participate in [facility] and further goals will be determined based upon results.      Start:  06/18/24    Expected End:  07/18/24             Rehab Potential: Rehab Potential Rehab Potential: Good Complexity/Co-morbidities that Impact POC: Cardiac compromise, Cognitive/Memory deficits, Reduced activity level, Severity of condition, Time since onset/Exacerbation, Neurologic Diagnosis, Depression/Anxiety, Nutrition Impairments/Limitations: Activity Tolerance Deficits, Balance Deficits, Basic Activity of Daily living deficits, Gross Motor deficits, IADL deficits, Muscle weakness, Mobility deficits, Cognitive Deficits, Coordination Deficits  Plan:  Frequency: OT Frequency: 3x week  Duration:  OT Duration: For 4 weeks  Recommend Interventions: Planned Treatment/Interventions: Basic activities of daily living, Patient/Caregiver education, Instrumental Activities of Daily Living, Therapeutic activities, Cognitive training, Therapeutic exercises, Energy Conservation training, Pain management, Functional Mobility training  SUBJECTIVE  Interdisciplinary Team Communication Communication: Patient, Nursing Communication Details: RN cleared for PT session; activity tolerance/performance; patient education General Family/Caregiver Details: None present Affect/Behavior: Impacted therapy session Affect/Behavior: Impulsive   Subjective: Pt pleasant and cooperative.   Pain: Pain Assessment Pain Assessment: No/denies pain  OBJECTIVE  Cognition Orientation Level: Oriented x4 Affect/Behavior: Impacted therapy  session Affect/Behavior: Impulsive Overall Cognitive Status: Within Functional Limits Safety Awareness: Impaired Insight: Decreased awareness of deficits Executive Functioning: Impaired Executive Functioning Impairment: Information Processing, Reasoning   Vitals:  BP after ambulation to/from bathroom (seated in chair): 105/66  BP after ambulating ~10-15 feet (seated in chair): 98/64  Interventions:  Functional Mobility:  Bed Mobility Sit to Supine: Independent Transfers Assistive Device: Walker-rolling Sit to Stand: Distant supervision Stand to Sit: Distant supervision Bed to/from Chair - Transfer Type: Stand Step Bed to/from Chair - Level of Assistance: Close supervision Toilet Transfers - To: Actuary Transfers - Type: Stand Step Toilet Transfers - Level of Assistance: Close supervision Mobility Gait Assistance: Scientist, physiological Device: Walker-rolling OT Mobility Additional Comments: pt displays unsafe use of RW however benefits from BUE support    Balance Sitting - Static: Independent Sitting - Dynamic: Independent Standing - Static: Close supervision Standing - Dynamic: Close supervision, Contact Guard Assistance   Self Care / Home Management:  ADLs Grooming: Set up (washing face) Grooming Level: Up in chair Toileting: Distant supervision Toileting Equipment: RW, Grab bars Toilet Transfers - To: Media planner - Type: Stand Step Assistive Device: Financial controller - Level of Assistance: Close supervision            Response to Intervention: Improved Activity tolerance , Improved ADL performance , Improved Functional Mobility , and Required therapeutic rest breaks   Outcomes AM-PAC - Daily Activity:  Flowsheet Row Most Recent Value  AM-PAC 6-Clicks - Daily Activity  Lower Body Clothing Activity A little  Bathing A little  Toileting A little  Upper Body Clothing Activity A little  Personal Grooming  None  Eating Meals None  AM-PAC Total Score 20    Condition After Therapy: Condition After Therapy: Back to bed, All needs within reach, Lines intact, Alarm activated  Treatment Times Time In: 1405 Time Out: 1424 Time in Timed codes: 19 Total Treatment Time: 19     Treatment/Procedures Time Entry Self Care/Home Management Training minutes: 14 Therapeutic Activity minutes: 5   Charges           06/20/2024   Code Description Service Provider Modifiers Quantity  [id number] Hc Ot Self-Care/Home Mgmt Training Each [address] [place], C[place]/L [place], [place]        Time of Service Note Type Status  None

## 2024-06-21 NOTE — Care Plan (Signed)
 Trauma Tertiary Survey Note  Date of Survey: 06/21/2024   Date of Admission: 06/15/2024  Imaging Trauma scans final reads reviewed? Yes Was CTA Neck indicated? No (Note: Any injury above the clavicle including significant facial bruising or lacerations w/o fracture merits CTA Neck.) Incidental findings clearly noted on patients chart? Yes  Incidentals:Moderate prostatomegaly, Kidneys: Incompletely characterized low-attenuation lesions in the left kidney.   Physical Exam Does the patient have any new complaints? No Does the patient endorse spine tenderness/pain? No Does the patient have tenderness on palpation to extremity, noticeable deformities, or decreased ROM? No Any new neurologic deficits since admission? No Does patient have significant lacerations, bruising, swelling, or wounds? No  Other Cervical Collar Cleared? Yes DVT ppx ordered? Yes Have PT, OT or Speech  and Rehab been consulted if appropriate? Yes Is patient age >76? Yes,  Yes Home Medications reviewed and resumed as appropriate? Yes Did this patient meet criteria for the [facility] DMV Medical Re-examination Form? No If yes, was one completed prior to discharge? N/A  Electronically signed by:  Lucie Dennis, MD 06/21/2024 12:52 PM

## 2024-06-21 NOTE — Progress Notes (Signed)
 ------------------------------------------------------------------------------- Attestation signed by Cooper Finder, OT/L at 06/21/2024 11:55 AM Per chart review and discussion with Lorane Irving, COTA/L, Pt has demonstrated steady progress towards all established goals. Pt has demonstrated adequate functional mobility and ADL performance and is clear for d/c home with the below recommendations. Pt with no continued acute OT needs. OT sign off. Please re-consult if new needs arise. See full treatment note listed below. See updated discharge recommendations listed in this attestation.   Recommendation OT Recommendation:  Clear for home with initial increased S/A and HHOT OT Equipment Recommended: None (Pt has all needed DME)    Electronically signed by: Cooper Finder, OT/L 06/21/2024 11:54 AM  -------------------------------------------------------------------------------  Acute Occupational Therapy Treatment  Visit Count: OT Visit Count: 4  Precautions: Other Therapy Precautions: Fall risk Bracing: Spinal Precautions - No Restrictions Comment: (+) orthostatic hypotension  Medical Diagnosis/Course: OT Diagnosis/Course Pertinent Medical Course: 73 yo male 7/29 after GLF. (+) R small PTX, R 5th rib fx, found to have complicated UTI. 7/30 s/p chest tube insertion. PMH: parkinson's, AAA, afib, multiple falls    Assessment and Plan  Assessment: Patient demonstrates significant progress towards goals with AM-PAC score of AM-PAC Total Score: 23 . Score improved due to changes in performance of toileting, LB dressing, and UB dressing. Score indicates a 15.86% impairment. Upon therapist arrival to room, pt supine in bed and agreeable to participate in therapy session. Pt transitioned from supine>seated EOB with Mod I and completed STS from EOB with distant supervision. Pt ambulated from EOB<>bathroom using RW with SBA-CGA. Pt completed all toileting tasks with Mod I. Pt ambulated from EOB<>door  with no AD and SBA-CGA. OTA provided education on fall prevention strategies with pt verbalizing understanding.    Problem List:  Impairments/Limitations: Activity Tolerance Deficits, Balance Deficits, Basic Activity of Daily living deficits, Gross Motor deficits, IADL deficits, Muscle weakness, Mobility deficits, Cognitive Deficits, Coordination Deficits  OT Recommendation: Recommendation  OT Recommendation:  COTA/L collaborated with the OTR/L regarding progression towards goals and current functional ADL task performance levels . Communication occurred via face to face discussion. Please see updates to this patient's plan of care made by the OTR/L in the addendum above.    OT Equipment Recommended: None  See OT note or addendum for specific discharge needs.  Home Living Environment: Type of Home House  Home Layout One level, Ramped entry  Exterior Stairs - Veterinary surgeon - number 5 steps at front door; has ramp access at back door  [employer] - Psychologist, sport and exercise - number    Shower/Tub Tub/Shower Unit  [employer] in shower, Paediatric nurse, Grab bars around toilet, Tax adviser, Raised toilet height  Psychologist, clinical, [employer], Wheelchair-electric, Cane-single point, Hospital bed  Equipment Currently Using depending on how pt felt or how dizzy he felt determined device he would use for mobility, if feeling really dizzy used electric wheelchair, a little dizzy used rollator walker, no dizziness used cane  Additional Comments     Prior Level of Function:  Level of Independence Requiring assistance  Lives With Alone (reports his wife lives in the house next door, they have had separate houses as she previously worked 3rd shift and he worked 1st shift)  Person(s) able to assist at d/c wife can provide increased supervision, per pt wife limited physical assist because she needs a knee replacement;  son is nearby and can provide PRN assist/supervision when not working  Patient Responsibilities  Caregiver-pet, Meal Preparation, Yard work, Personal ADLs, Manage Medications, Home management, Social Participation  Requires Assist With  Home Management (wife assists as needed)   OT Goals: Encounter Problems     Encounter Problems (Active)     Occupational Therapy     Patient will perform bathing with distant supervision with shower chair     Start:  06/18/24    Expected End:  07/18/24         Patient will perform grooming Independently     Start:  06/18/24    Expected End:  07/18/24         Patient will perform upper body dressing Independently     Start:  06/18/24    Expected End:  07/18/24         Patient will perform lower body dressing with distant supervision     Start:  06/18/24    Expected End:  07/18/24         Patient will perform toileting Independently     Start:  06/18/24    Expected End:  07/18/24             Patient/caregiver will be able to verbalize 3 fall prevention strategies to decrease risk of falls during functional mobility     Start:  06/18/24    Expected End:  07/18/24         Patient's ADLs Inpatient Short Form score will increase to 21/24 to indicate improved function.      Start:  06/18/24    Expected End:  07/18/24         Patient will participate in [facility] and further goals will be determined based upon results.      Start:  06/18/24    Expected End:  07/18/24             Rehab Potential: Rehab Potential Rehab Potential: Good Complexity/Co-morbidities that Impact POC: Cardiac compromise, Cognitive/Memory deficits, Reduced activity level, Severity of condition, Time since onset/Exacerbation, Neurologic Diagnosis, Depression/Anxiety, Nutrition Impairments/Limitations: Activity Tolerance Deficits, Balance Deficits, Basic Activity of Daily living deficits, Gross Motor deficits, IADL deficits, Muscle weakness, Mobility deficits,  Cognitive Deficits, Coordination Deficits  Plan:  Frequency: OT Frequency: 3x week  Duration:  OT Duration: For 4 weeks  Recommend Interventions: Planned Treatment/Interventions: Basic activities of daily living, Patient/Caregiver education, Instrumental Activities of Daily Living, Therapeutic activities, Cognitive training, Therapeutic exercises, Energy Conservation training, Pain management, Functional Mobility training  SUBJECTIVE  Interdisciplinary Team Communication Communication: Patient, Nursing Communication Details: RN cleared pt for session; provided education to pt on fall prevention General Family/Caregiver Details: None present Affect/Behavior: Impulsive   Subjective: Pt pleasant and cooperative.   Pain: Pain Assessment Pain Assessment: No/denies pain  OBJECTIVE  Cognition Orientation Level: Oriented x4 Affect/Behavior: Impulsive Overall Cognitive Status: Within Functional Limits Safety Awareness: Impaired Insight: Decreased awareness of deficits Executive Functioning: Impaired Executive Functioning Impairment: Reasoning, Information Processing   Vitals:  BP supine start of session: 117/69 BP supine after ambulation: 116/69  Interventions:  Functional Mobility:  Bed Mobility Supine to Sit: Independent Sit to Supine: Independent Transfers Assistive Device: Walker-rolling Sit to Stand: Distant supervision Stand to Sit: Distant supervision Toilet Transfers - To: Media planner - Type: Stand Step Toilet Transfers - Level of Assistance: Distant supervision Mobility Gait Assistance: Geophysicist/field seismologist, Close supervision Assistive Device: Walker-rolling OT Mobility Additional Comments: pt displays unsafe use of RW    Balance Sitting - Static: Independent Sitting - Dynamic: Independent Standing - Static: Close supervision  Standing - Dynamic: Contact Guard Assistance, Close supervision   Self Care / Home Management:  ADLs LB  Dressing:  (pt reports difficulty with donning socks) Toileting: Independent Toileting Equipment: RW Event organiser - To: Media planner - Type: Stand Step Assistive Device: Financial controller - Level of Assistance: Distant supervision            Response to Intervention: Improved Activity tolerance , Improved ADL performance , and Improved Functional Mobility   Outcomes AM-PAC - Daily Activity:    Flowsheet Row Most Recent Value  AM-PAC 6-Clicks - Daily Activity  Lower Body Clothing Activity A little  Bathing None  Toileting None  Upper Body Clothing Activity None  Personal Grooming None  Eating Meals None  AM-PAC Total Score 23     Condition After Therapy: Condition After Therapy: Back to bed, All needs within reach, Lines intact  Treatment Times Time In: 1010 Time Out: 1032/07/01 Time in Timed codes: 23 Total Treatment Time: 23     Treatment/Procedures Time Entry Self Care/Home Management Training minutes: 15 Therapeutic Activity minutes: 8   Charges           06/21/2024   Code Description Service Provider Modifiers Quantity  [id number] Hc Ot Therapeut Actvity Direct Pt Contact Each [address] COTA/L GO, CO 1        [id number] Hc Ot Self-Care/Home Mgmt Training Each 15 Minutes Alexandra R Hicks, COTA/L GO, CO 1        Time of Service Note Type Status  1050 Progress Notes Cosign Needed

## 2024-06-21 NOTE — Progress Notes (Signed)
 Acute Physical Therapy Treatment  Visit Count: PT Visit Count: 3  Precautions: Other Therapy Precautions: Fall risk Bracing: Spinal Precautions - No Restrictions Comment: monitor vitals  Medical Diagnosis/Course: PT Diagnosis/Course Pertinent Medical Course: 73 yo male 7/29 after GLF. (+) R small PTX, R 5th rib fx, found to have complicated UTI. 7/30 s/p chest tube insertion. PMH: parkinson's, AAA, afib, multiple falls  Assessment and Plan  Assessment: Pt agreeable to participate.  Pt transitions quickly reinforced to pt transition slowly to prevent dizziness/lightheadedness. Pt is receptive to all education and verbalizing understanding. Pt has made adequate progress toward goals for discharge home with family assist. Pt vitals remained stable throughout session and pt denies any symptoms of lightheadedness, no dizziness, no nausea or SOB during session (see vitals below).     Currently pt requires independent with bed mobility; supervision with transfers; supervision with gait with RW ~200 feet, up/down 4 steps with rail with SBA, reciprocal pattern. Patient scored 22/24 on the [facility] Mobility Assessment indicating minimal impairment of functional mobility. Pt scored 11/12 on Short physical performance Battery indicating minimal impairment with basic mobility. Score impacted by gait speed using RW. Pt has gait speed of 0.75 indicating a limited community ambulator.   Problem list: Impairments/Limitations: Activity Tolerance Deficits, Ambulation Deficits, Balance Deficits, Bed mobility deficits, Range of Motion deficits, Mobility deficits, Transfer deficits, Muscle weakness, Safety Awareness deficits   PT Recommendation: PT Recommendation: Home with intermittent supervision, Home with intermittent assistance, No skilled PT needs at discharge PT Equipment Recommended: None (has all necessary DME)  PT recommends: assistance with IADLs (home management, meal preparations/cooking, pet care,  shopping) and assistance with transportation  Home Living Environment: Type of Home House  Home Layout One level, Ramped entry  Exterior Stairs - number 5 steps at front door; has ramp access at back Hydrologist Stairs - Psychologist, sport and exercise - number    Interior Stairs - Administrator, sports / Tub Tub/Shower Unit  [employer] in shower, Paediatric nurse, Grab bars around toilet, Tax adviser, Raised toilet height  Psychologist, clinical, [employer], Wheelchair-electric, Cane-single point, Hospital bed  Equipment Currently Using depending on how pt felt or how dizzy he felt determined device he would use for mobility, if feeling really dizzy used electric wheelchair, a little dizzy used rollator walker, no dizziness used cane  Additional Comments     Prior Level of Function: Level of Independence Requiring assistance  Lives With Alone (reports his wife lives in the house next door, they have had separate houses as she previously worked 3rd shift and he worked 1st shift)  Person(s) able to assist at d/c wife can provide increased supervision, per pt wife limited physical assist because she needs a knee replacement; son is nearby and can provide PRN assist/supervision when not working  Patient Barrister's clerk, Sales executive, Psychologist, sport and exercise work, Copywriter, advertising ADLs, Manage Medications, Water quality scientist, Social Participation  Requires Assist With Home Management (wife assists as needed)   PT Goals:  Patient and Family Goals: I want to be able to walk Treatment Plan/Goals Established with Patient/Caregiver: Yes  Encounter Problems     Encounter Problems (Active)     Physical Therapy     Patient will perform rolling Left/Right Independently     Start:  06/17/24    Expected End:  07/08/24             Patient will go supine to/from sit Independently  Start:  06/17/24    Expected End:  07/08/24             Patient  will perform sit-to-stand with distant supervision using most appropriate device     Start:  06/17/24    Expected End:  07/08/24             Patient will ambulate 100 feet with distant supervision using most appropriate device     Start:  06/17/24    Expected End:  07/08/24         Patient will improve sitting balance to independent with static and dynamic sitting task     Start:  06/17/24    Expected End:  07/08/24         Patient will improve standing balance to supervision with static and dynamic standing task     Start:  06/17/24    Expected End:  07/08/24         Patient/Caregiver will demonstrate precautions independently     Start:  06/17/24    Expected End:  07/08/24             Patient will propel wheelchair 150 feet Independently     Start:  06/17/24    Expected End:  07/08/24         Patient/caregiver will be able to verbalize 3 fall prevention strategies to decrease risk of falls during functional mobility     Start:  06/17/24    Expected End:  07/08/24         Pt will demonstrate improved basic functional mobility based on a score increase from 10 to 21/24 on the AM [facility] Basic Mobility Inpatient Short Form.      Start:  06/17/24    Expected End:  07/08/24             Rehab Potential:  Rehab Potential: Good Complexity/Co-morbidities that Impact POC: Pain, Reduced activity level, Time since onset/Exacerbation Impairments/Limitations: Activity Tolerance Deficits, Ambulation Deficits, Balance Deficits, Bed mobility deficits, Range of Motion deficits, Mobility deficits, Transfer deficits, Muscle weakness, Safety Awareness deficits  Plan:  Patient's Prior Level of Function Requiring assistance Needs a Little Help (AMPAC 12-21)    (2pts)   Patient's Current Level of Function (22) Independent / Mod I (AMPAC 22-24)     (3pts)   Likelihood for functional decline without therapy .  Not Likely   (1pt)  Likelihood  to make significant functional gains with  therapy. Not Likely   (1pt)  Acute therapy likely to change discharge disposition. Not Likely   (1pt)   Recommended Frequency: Discharge therapy  PT Frequency: Discharge  Recommend Duration:  Discharge  Recommend Interventions: Patient/Caregiver education, Therapeutic activity, Therapeutic exercise, Gait/mobility training, Wheelchair management/mobility   SUBJECTIVE  Interdisciplinary Team Communication Communication: Patient, Nursing, MD Communication Details: RN cleared pt for session and made aware of pt performance and location at end of session. MD notified of pt clearance for discharge from PT standpoint   General Family/Caregiver Present: no  Subjective: I'm ready to go home, I miss my wife and dog.   Pain: Pain Assessment Pain Assessment: No/denies pain   OBJECTIVE  Cognition Orientation Level: Oriented x4 Safety Awareness: Intact  Vitals:   Therapy Activity Vitals - At Rest At Rest Position: Sitting At Rest Heart Rate: 82 At Rest BP: 104/71 (MAP 79) BP Location: Left arm BP Method: Automatic At Rest SpO2: 100 % At Rest O2 Device: None (Room air) Therapy Activity Vitals - With Activity With Ambulation/Activity  Position: Sitting With Ambulation/Activity Heart Rate: 79 With Ambulation/Activity BP: 113/74 (MAP 82) BP Location: Left arm BP Method: Automatic With Ambulation/Activity SpO2 on Room Air: 100 % With Ambulation/Activity O2 Device: None (Room air) Therapy Activity Vitals - Post Activity Post Ambulation/Activity Position: Sitting Post Ambulation/Activity Heart Rate: 83 Post Ambulation/Activity BP: 96/66 (MAP 73) BP Location: Left arm BP Method: Automatic Post Ambulation/Activity SpO2: 100 % Post Ambulation/Activity O2 Device: None (Room air)  Balance: Sitting - Static: Independent Sitting - Dynamic: Independent Standing - Static: Distant supervision Standing - Dynamic: Distant supervision Balance Additional Comments: standing with BUE and  without UE support, no loss of balance, able to maintain balance with manipulation of base of support, increase postural sway but no loss of balance, able to maintain feet together, semi tandem and tandem stance x10 secs without LOB  Skin Integrity and Edema:  Skin Integrity & Edema Skin: Intact in visualized areas Edema: No edema Part of Body: no edema BLE or BUE   Interventions: Therapeutic Activity:  Bed Mobility Supine to Sit: Independent Bed Mobility Additional Comments: HOB near flat, no use of rail; sit>supine not tested sitting chair at end of session Transfers Assistive Device: Walker-rolling Sit to Stand: Distant supervision Stand to Sit: Distant supervision Transfers  Additional Comments: to RW, from EOB, chair and wheelchair Gait Training: Assistive Device: Financial controller (ft): 200 ft Gait Assistance: Distant supervision Gait Additional Comments: reciprocal pattern, at times asked to reduce speed of  movement for correct use of RW PT Mobility Additional Comments: up/down 4 steps reciprocal pattern x2 trials, using 1 rail,  Stairs: Close supervision Handrails: One rail R Number of Stairs climbed: 4 Education on fall prevention, safety, safety around pets, removal of trip hazards.      Outcomes AM-PAC - Basic Mobility:    Flowsheet Row Most Recent Value  AM-PAC 6-Clicks - Basic Mobility  Turning from you back to your side while in a flat bed without using bed rails? None  Moving from lying on your back to sitting on the side of a flat bed without using bed rails? None  Moving to and from a bed to a chair (including a wheelchair)? None  Standing up from a chair using your arms (e.g, wheelchair, or bedside chair)? A little  To walk in a hospital room? A little  Climbing 3-5 steps with a railing? None  AM-PAC Total Score 22   SPPB Short Physical Performance Battery (SPPB) Side by Side Stand Time Score: Held for 10 sec Semi Tandem Stand Time Score: Held  for 10 sec Tandem Stand Time: Held for 10 seconds >>Total Balance Tests score: 4 Time for first 4-meter walk: 4.82 to 6.20 seconds Aids for first walk: Rolling walker Time for second 4-meter walk: 4.82 to 6.20 seconds Aids for second walk: Rolling walker Time for the shorter of the two walks: 5.35 >>Total Gait Speed Test Scoring: 3 Safe to stand without help: Yes Single Chair Stand Results: Participant stood without using arms Safe to stand five times: Yes Time to complete five chair stands: 10.34 >>Repeated chair test score: 4 - 11.19 seconds or less Score: 11   Condition After Therapy:  Up in chair, Nursing notified of condition, All needs within reach, Fall interventions in place, Lines intact  Treatment Times Time In: 1355 Time Out: 1420 Time in Timed codes: 25 Total Treatment Time: 25     Treatment/Procedures Time Entry Therapeutic Activity minutes: 25   Charges  06/21/2024   Code Description Service Provider Modifiers Quantity  [id number] Hc Pt Therapeut Actvity Direct Pt Contact Each [address] Terrea Bathe, [place] GP 2        Time of Service Note Type Status  None

## 2024-06-21 NOTE — Progress Notes (Signed)
-------------------------------------------------------------------------------   Attestation signed by Waddell Fausto Solders, [facility] at 06/21/2024 12:15 PM I reviewed the student's note, discussed session and agree with their findings.   Waddell Solders, MA, [facility], LCAS Clinical Services Manager Substance Use Counselor [phone]  -------------------------------------------------------------------------------  ARCH NOTE              06/21/2024   Michael Fitzgerald, a 73 y.o. male   Brief Intervention SBIRT Screening: (P) Screened positive SBIRT Services Provided: (P) Screening & brief intervention SBIRT Interventions Provided: (P) Discussed use history, Highlighted importance of building/maintaining positive social support networks, Provided psychoeducation, Trained in relaxation, meditation, mindfulness, and/or deep breathing techniques, Trained in stress management and coping strategies Additional Support: (P) Mental health support Length of Session: (P) 30 Stages of change: (P) N/A   Mental Health Risk Screening Mental Health Risk Screening: (P) Screened positive Mental Health Services Provided: (P) Screening & risk reduction/prevention Mental Health Interventions Provided: (P) Highlighted importance of building/maintaining positive social support networks, Provided psychoeducation, Trained in relaxation, meditation, mindfulness, and/or deep breathing techniques, Trained in stress management and coping strategies Additional Support: (P) Mental health support Length of Session: (P) 30        ITSS Risk Score Score PTSD risk: (P) 1 Score Depression risk: (P) 1                Michael Fitzgerald, Student Counselor

## 2024-06-22 ENCOUNTER — Telehealth: Payer: Self-pay

## 2024-06-22 NOTE — Transitions of Care (Post Inpatient/ED Visit) (Signed)
   06/22/2024  Name: Michael Fitzgerald MRN: [id number] DOB: 1951/10/25  Today's TOC FU Call Status: Today's TOC FU Call Status:: Unsuccessful Call (1st Attempt) Unsuccessful Call (1st Attempt) Date: 06/22/24  Attempted to reach the patient regarding the most recent Inpatient/ED visit.  Follow Up Plan: Additional outreach attempts will be made to reach the patient to complete the Transitions of Care (Post Inpatient/ED visit) call.   Vicky Mccanless J. Roux Brandy RN, MSN [facility], [facility] RN Care Manager Direct Dial: [phone]  Fax: [phone] Website: [email or web address]

## 2024-06-23 ENCOUNTER — Telehealth: Payer: Self-pay

## 2024-06-23 NOTE — Transitions of Care (Post Inpatient/ED Visit) (Signed)
   06/23/2024  Name: Michael Fitzgerald MRN: [id number] DOB: 1951/04/13  Today's TOC FU Call Status: Today's TOC FU Call Status:: Unsuccessful Call (2nd Attempt) Unsuccessful Call (2nd Attempt) Date: 06/23/24  Attempted to reach the patient regarding the most recent Inpatient/ED visit.  Follow Up Plan: Additional outreach attempts will be made to reach the patient to complete the Transitions of Care (Post Inpatient/ED visit) call.   [name] J. Jermani Pund RN, MSN [facility], [facility] Health RN Care Manager Direct Dial: [phone]  Fax: [phone] Website: [email or web address]

## 2024-06-24 ENCOUNTER — Telehealth: Payer: Self-pay

## 2024-06-24 DIAGNOSIS — G20A1 Parkinson's disease without dyskinesia, without mention of fluctuations: Secondary | ICD-10-CM | POA: Diagnosis not present

## 2024-06-24 DIAGNOSIS — Z9181 History of falling: Secondary | ICD-10-CM | POA: Diagnosis not present

## 2024-06-24 DIAGNOSIS — I714 Abdominal aortic aneurysm, without rupture, unspecified: Secondary | ICD-10-CM | POA: Diagnosis not present

## 2024-06-24 DIAGNOSIS — S2231XD Fracture of one rib, right side, subsequent encounter for fracture with routine healing: Secondary | ICD-10-CM | POA: Diagnosis not present

## 2024-06-24 DIAGNOSIS — I4891 Unspecified atrial fibrillation: Secondary | ICD-10-CM | POA: Diagnosis not present

## 2024-06-24 DIAGNOSIS — E785 Hyperlipidemia, unspecified: Secondary | ICD-10-CM | POA: Diagnosis not present

## 2024-06-24 DIAGNOSIS — Z87440 Personal history of urinary (tract) infections: Secondary | ICD-10-CM | POA: Diagnosis not present

## 2024-06-24 DIAGNOSIS — N400 Enlarged prostate without lower urinary tract symptoms: Secondary | ICD-10-CM | POA: Diagnosis not present

## 2024-06-24 NOTE — Transitions of Care (Post Inpatient/ED Visit) (Signed)
   06/24/2024  Name: AVYN COATE MRN: [id number] DOB: 1951-01-18  Today's TOC FU Call Status: Today's TOC FU Call Status:: Unsuccessful Call (3rd Attempt) Unsuccessful Call (3rd Attempt) Date: 06/24/24  Attempted to reach the patient regarding the most recent Inpatient/ED visit.  Follow Up Plan: No further outreach attempts will be made at this time. We have been unable to contact the patient.  Dot Splinter J. Seleen Walter RN, MSN [facility], [facility] Health RN Care Manager Direct Dial: [phone]  Fax: [phone] Website: [email or web address]

## 2024-06-29 DIAGNOSIS — S2231XD Fracture of one rib, right side, subsequent encounter for fracture with routine healing: Secondary | ICD-10-CM | POA: Diagnosis not present

## 2024-06-29 DIAGNOSIS — I714 Abdominal aortic aneurysm, without rupture, unspecified: Secondary | ICD-10-CM | POA: Diagnosis not present

## 2024-06-29 DIAGNOSIS — Z87440 Personal history of urinary (tract) infections: Secondary | ICD-10-CM | POA: Diagnosis not present

## 2024-06-29 DIAGNOSIS — Z9181 History of falling: Secondary | ICD-10-CM | POA: Diagnosis not present

## 2024-06-29 DIAGNOSIS — I4891 Unspecified atrial fibrillation: Secondary | ICD-10-CM | POA: Diagnosis not present

## 2024-06-29 DIAGNOSIS — G20A1 Parkinson's disease without dyskinesia, without mention of fluctuations: Secondary | ICD-10-CM | POA: Diagnosis not present

## 2024-06-29 DIAGNOSIS — E785 Hyperlipidemia, unspecified: Secondary | ICD-10-CM | POA: Diagnosis not present

## 2024-06-29 DIAGNOSIS — N400 Enlarged prostate without lower urinary tract symptoms: Secondary | ICD-10-CM | POA: Diagnosis not present

## 2024-07-01 DIAGNOSIS — G20A1 Parkinson's disease without dyskinesia, without mention of fluctuations: Secondary | ICD-10-CM | POA: Diagnosis not present

## 2024-07-01 DIAGNOSIS — E785 Hyperlipidemia, unspecified: Secondary | ICD-10-CM | POA: Diagnosis not present

## 2024-07-01 DIAGNOSIS — Z9181 History of falling: Secondary | ICD-10-CM | POA: Diagnosis not present

## 2024-07-01 DIAGNOSIS — N400 Enlarged prostate without lower urinary tract symptoms: Secondary | ICD-10-CM | POA: Diagnosis not present

## 2024-07-01 DIAGNOSIS — I714 Abdominal aortic aneurysm, without rupture, unspecified: Secondary | ICD-10-CM | POA: Diagnosis not present

## 2024-07-01 DIAGNOSIS — Z87440 Personal history of urinary (tract) infections: Secondary | ICD-10-CM | POA: Diagnosis not present

## 2024-07-01 DIAGNOSIS — I4891 Unspecified atrial fibrillation: Secondary | ICD-10-CM | POA: Diagnosis not present

## 2024-07-01 DIAGNOSIS — S2231XD Fracture of one rib, right side, subsequent encounter for fracture with routine healing: Secondary | ICD-10-CM | POA: Diagnosis not present

## 2024-07-05 DIAGNOSIS — J9312 Secondary spontaneous pneumothorax: Secondary | ICD-10-CM | POA: Diagnosis not present

## 2024-07-05 DIAGNOSIS — Z681 Body mass index (BMI) 19 or less, adult: Secondary | ICD-10-CM | POA: Diagnosis not present

## 2024-07-05 DIAGNOSIS — J939 Pneumothorax, unspecified: Secondary | ICD-10-CM | POA: Diagnosis not present

## 2024-07-06 DIAGNOSIS — Z9181 History of falling: Secondary | ICD-10-CM | POA: Diagnosis not present

## 2024-07-06 DIAGNOSIS — I4891 Unspecified atrial fibrillation: Secondary | ICD-10-CM | POA: Diagnosis not present

## 2024-07-06 DIAGNOSIS — S2231XD Fracture of one rib, right side, subsequent encounter for fracture with routine healing: Secondary | ICD-10-CM | POA: Diagnosis not present

## 2024-07-06 DIAGNOSIS — I714 Abdominal aortic aneurysm, without rupture, unspecified: Secondary | ICD-10-CM | POA: Diagnosis not present

## 2024-07-06 DIAGNOSIS — N400 Enlarged prostate without lower urinary tract symptoms: Secondary | ICD-10-CM | POA: Diagnosis not present

## 2024-07-06 DIAGNOSIS — Z87440 Personal history of urinary (tract) infections: Secondary | ICD-10-CM | POA: Diagnosis not present

## 2024-07-06 DIAGNOSIS — E785 Hyperlipidemia, unspecified: Secondary | ICD-10-CM | POA: Diagnosis not present

## 2024-07-06 DIAGNOSIS — G20A1 Parkinson's disease without dyskinesia, without mention of fluctuations: Secondary | ICD-10-CM | POA: Diagnosis not present

## 2024-07-08 ENCOUNTER — Ambulatory Visit: Admitting: Internal Medicine

## 2024-07-08 DIAGNOSIS — I714 Abdominal aortic aneurysm, without rupture, unspecified: Secondary | ICD-10-CM | POA: Diagnosis not present

## 2024-07-08 DIAGNOSIS — E785 Hyperlipidemia, unspecified: Secondary | ICD-10-CM | POA: Diagnosis not present

## 2024-07-08 DIAGNOSIS — Z9181 History of falling: Secondary | ICD-10-CM | POA: Diagnosis not present

## 2024-07-08 DIAGNOSIS — S2231XD Fracture of one rib, right side, subsequent encounter for fracture with routine healing: Secondary | ICD-10-CM | POA: Diagnosis not present

## 2024-07-08 DIAGNOSIS — Z87440 Personal history of urinary (tract) infections: Secondary | ICD-10-CM | POA: Diagnosis not present

## 2024-07-08 DIAGNOSIS — I4891 Unspecified atrial fibrillation: Secondary | ICD-10-CM | POA: Diagnosis not present

## 2024-07-08 DIAGNOSIS — G20A1 Parkinson's disease without dyskinesia, without mention of fluctuations: Secondary | ICD-10-CM | POA: Diagnosis not present

## 2024-07-08 DIAGNOSIS — N400 Enlarged prostate without lower urinary tract symptoms: Secondary | ICD-10-CM | POA: Diagnosis not present

## 2024-07-09 ENCOUNTER — Encounter: Payer: Self-pay | Admitting: Internal Medicine

## 2024-08-02 DIAGNOSIS — I4891 Unspecified atrial fibrillation: Secondary | ICD-10-CM | POA: Diagnosis not present

## 2024-08-02 DIAGNOSIS — Z9181 History of falling: Secondary | ICD-10-CM | POA: Diagnosis not present

## 2024-08-02 DIAGNOSIS — N400 Enlarged prostate without lower urinary tract symptoms: Secondary | ICD-10-CM | POA: Diagnosis not present

## 2024-08-02 DIAGNOSIS — G20A1 Parkinson's disease without dyskinesia, without mention of fluctuations: Secondary | ICD-10-CM | POA: Diagnosis not present

## 2024-08-02 DIAGNOSIS — I714 Abdominal aortic aneurysm, without rupture, unspecified: Secondary | ICD-10-CM | POA: Diagnosis not present

## 2024-08-02 DIAGNOSIS — Z87440 Personal history of urinary (tract) infections: Secondary | ICD-10-CM | POA: Diagnosis not present

## 2024-08-02 DIAGNOSIS — E785 Hyperlipidemia, unspecified: Secondary | ICD-10-CM | POA: Diagnosis not present

## 2024-08-02 DIAGNOSIS — S2231XD Fracture of one rib, right side, subsequent encounter for fracture with routine healing: Secondary | ICD-10-CM | POA: Diagnosis not present

## 2024-08-12 DIAGNOSIS — I10 Essential (primary) hypertension: Secondary | ICD-10-CM | POA: Diagnosis not present

## 2024-08-12 DIAGNOSIS — E7849 Other hyperlipidemia: Secondary | ICD-10-CM | POA: Diagnosis not present

## 2024-09-01 DIAGNOSIS — G20C Parkinsonism, unspecified: Secondary | ICD-10-CM | POA: Diagnosis not present

## 2024-09-01 DIAGNOSIS — E7849 Other hyperlipidemia: Secondary | ICD-10-CM | POA: Diagnosis not present

## 2024-09-01 DIAGNOSIS — I10 Essential (primary) hypertension: Secondary | ICD-10-CM | POA: Diagnosis not present

## 2024-09-01 DIAGNOSIS — N400 Enlarged prostate without lower urinary tract symptoms: Secondary | ICD-10-CM | POA: Diagnosis not present

## 2024-09-03 DIAGNOSIS — I480 Paroxysmal atrial fibrillation: Secondary | ICD-10-CM | POA: Diagnosis not present

## 2024-09-03 DIAGNOSIS — J449 Chronic obstructive pulmonary disease, unspecified: Secondary | ICD-10-CM | POA: Diagnosis not present

## 2024-09-03 DIAGNOSIS — E7849 Other hyperlipidemia: Secondary | ICD-10-CM | POA: Diagnosis not present

## 2024-09-03 DIAGNOSIS — N400 Enlarged prostate without lower urinary tract symptoms: Secondary | ICD-10-CM | POA: Diagnosis not present

## 2024-09-03 DIAGNOSIS — I10 Essential (primary) hypertension: Secondary | ICD-10-CM | POA: Diagnosis not present

## 2024-09-23 DIAGNOSIS — E7849 Other hyperlipidemia: Secondary | ICD-10-CM | POA: Diagnosis not present

## 2024-09-23 DIAGNOSIS — I10 Essential (primary) hypertension: Secondary | ICD-10-CM | POA: Diagnosis not present

## 2024-11-17 DIAGNOSIS — Z91040 Latex allergy status: Secondary | ICD-10-CM | POA: Diagnosis not present

## 2024-11-17 DIAGNOSIS — R531 Weakness: Secondary | ICD-10-CM | POA: Insufficient documentation

## 2024-11-17 DIAGNOSIS — G20A1 Parkinson's disease without dyskinesia, without mention of fluctuations: Secondary | ICD-10-CM | POA: Diagnosis not present

## 2024-11-17 DIAGNOSIS — J449 Chronic obstructive pulmonary disease, unspecified: Secondary | ICD-10-CM | POA: Insufficient documentation

## 2024-11-17 DIAGNOSIS — R252 Cramp and spasm: Secondary | ICD-10-CM | POA: Diagnosis present

## 2024-11-17 DIAGNOSIS — G479 Sleep disorder, unspecified: Secondary | ICD-10-CM | POA: Insufficient documentation

## 2024-11-17 DIAGNOSIS — N3001 Acute cystitis with hematuria: Secondary | ICD-10-CM | POA: Insufficient documentation

## 2024-11-18 ENCOUNTER — Encounter (HOSPITAL_COMMUNITY): Payer: Self-pay

## 2024-11-18 ENCOUNTER — Emergency Department (HOSPITAL_COMMUNITY)
Admission: EM | Admit: 2024-11-18 | Discharge: 2024-11-18 | Disposition: A | Discharge status: Home / Self Care | Attending: Emergency Medicine | Admitting: Emergency Medicine

## 2024-11-18 ENCOUNTER — Other Ambulatory Visit: Payer: Self-pay

## 2024-11-18 DIAGNOSIS — G20C Parkinsonism, unspecified: Secondary | ICD-10-CM

## 2024-11-18 DIAGNOSIS — N3001 Acute cystitis with hematuria: Secondary | ICD-10-CM

## 2024-11-18 DIAGNOSIS — M791 Myalgia, unspecified site: Secondary | ICD-10-CM

## 2024-11-18 LAB — CBC WITH DIFFERENTIAL/PLATELET
Abs Immature Granulocytes: 0.02 K/uL (ref 0.00–0.07)
Basophils Absolute: 0.1 K/uL (ref 0.0–0.1)
Basophils Relative: 1 %
Eosinophils Absolute: 0.1 K/uL (ref 0.0–0.5)
Eosinophils Relative: 1 %
HCT: 33.8 % — ABNORMAL LOW (ref 39.0–52.0)
Hemoglobin: 11.4 g/dL — ABNORMAL LOW (ref 13.0–17.0)
Immature Granulocytes: 0 %
Lymphocytes Relative: 23 %
Lymphs Abs: 1.7 K/uL (ref 0.7–4.0)
MCH: 33.5 pg (ref 26.0–34.0)
MCHC: 33.7 g/dL (ref 30.0–36.0)
MCV: 99.4 fL (ref 80.0–100.0)
Monocytes Absolute: 0.6 K/uL (ref 0.1–1.0)
Monocytes Relative: 8 %
Neutro Abs: 4.9 K/uL (ref 1.7–7.7)
Neutrophils Relative %: 67 %
Platelets: 165 K/uL (ref 150–400)
RBC: 3.4 MIL/uL — ABNORMAL LOW (ref 4.22–5.81)
RDW: 14.5 % (ref 11.5–15.5)
WBC: 7.3 K/uL (ref 4.0–10.5)
nRBC: 0 % (ref 0.0–0.2)

## 2024-11-18 LAB — URINALYSIS, ROUTINE W REFLEX MICROSCOPIC
Bilirubin Urine: NEGATIVE
Glucose, UA: NEGATIVE mg/dL
Ketones, ur: NEGATIVE mg/dL
Nitrite: NEGATIVE
Protein, ur: NEGATIVE mg/dL
Specific Gravity, Urine: 1.017 (ref 1.005–1.030)
WBC, UA: >50 WBC/hpf (ref 0–5)
pH: 6 (ref 5.0–8.0)

## 2024-11-18 LAB — COMPREHENSIVE METABOLIC PANEL WITH GFR
ALT: <5 U/L (ref 0–44)
AST: 14 U/L — ABNORMAL LOW (ref 15–41)
Albumin: 3.8 g/dL (ref 3.5–5.0)
Alkaline Phosphatase: 80 U/L (ref 38–126)
Anion gap: 8 (ref 5–15)
BUN: 22 mg/dL (ref 8–23)
CO2: 24 mmol/L (ref 22–32)
Calcium: 8.6 mg/dL — ABNORMAL LOW (ref 8.9–10.3)
Chloride: 111 mmol/L (ref 98–111)
Creatinine, Ser: 0.72 mg/dL (ref 0.61–1.24)
GFR, Estimated: >60 mL/min
Glucose, Bld: 108 mg/dL — ABNORMAL HIGH (ref 70–99)
Potassium: 3.8 mmol/L (ref 3.5–5.1)
Sodium: 143 mmol/L (ref 135–145)
Total Bilirubin: 0.3 mg/dL (ref 0.0–1.2)
Total Protein: 5.7 g/dL — ABNORMAL LOW (ref 6.5–8.1)

## 2024-11-18 LAB — MAGNESIUM: Magnesium: 2.2 mg/dL (ref 1.7–2.4)

## 2024-11-18 LAB — CK: Total CK: 75 U/L (ref 49–397)

## 2024-11-18 MED ORDER — LORAZEPAM 1 MG PO TABS
1.0000 mg | ORAL_TABLET | Freq: Once | ORAL | Status: AC
  Administered 2024-11-18: 1 mg via ORAL
  Filled 2024-11-18: qty 1

## 2024-11-18 MED ORDER — HYDROCODONE-ACETAMINOPHEN 5-325 MG PO TABS
1.0000 | ORAL_TABLET | Freq: Once | ORAL | Status: AC
  Administered 2024-11-18: 1 via ORAL
  Filled 2024-11-18: qty 1

## 2024-11-18 MED ORDER — FOSFOMYCIN TROMETHAMINE 3 G PO PACK
3.0000 g | PACK | Freq: Once | ORAL | Status: AC
  Administered 2024-11-18: 3 g via ORAL
  Filled 2024-11-18: qty 3

## 2024-11-18 NOTE — ED Triage Notes (Signed)
 Pt pov from home. Pt reports muscle spasms, and generalized pain. Pt has been unable to sleep for 3 days due to the pain. PMH of Parkinson's.

## 2024-11-18 NOTE — ED Provider Notes (Signed)
 " [facility] Provider Note   CSN: [id number] Arrival date & time: 11/17/24  2351     Patient presents with: Muscle Pain   MILLION MAHARAJ is a 74 y.o. male.   The history is provided by the patient and a relative.  Patient with history of Parkinson disease presents with generalized weakness and muscle cramps in both legs No recent falls or trauma.  No fevers or vomiting.  No new cough or congestion.  No chest pain or abdominal pain.  He reports pain in the muscles of both legs He also reports difficulty walking due to pain and weakness.  No new back pain. No numbness in his legs. 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 Authorizing Provider  albuterol  (PROVENTIL  HFA;VENTOLIN  HFA) 108 (90 BASE) MCG/ACT inhaler Inhale into the lungs every 6 (six) hours as needed for wheezing or shortness of breath.    [provider]  alfuzosin  (UROXATRAL ) 10 MG 24 hr tablet Take 10 mg by mouth daily with breakfast.    [provider]  ALPRAZolam  (XANAX ) 1 MG tablet Take 1 mg by mouth 2 (two) times daily.  Patient not taking: Reported on 05/19/2024    [provider]  carbidopa -levodopa  (SINEMET  IR) 25-100 MG tablet TAKE  2 TABLETS FOUR TIMES DAILY 12/06/20   Gayland Lauraine PARAS, NP  carbidopa -levodopa -entacapone (STALEVO) 31.25-125-200 MG tablet Take 2 tablets by mouth 2 (two) times daily. 03/16/21   [provider]  cyanocobalamin  (VITAMIN B12) 500 MCG tablet Take 500 mcg by mouth daily.    [provider]  finasteride  (PROSCAR ) 5 MG tablet Take 1 tablet (5 mg total) by mouth daily. 12/06/21   Watt Rush, MD  fludrocortisone (FLORINEF) 0.1 MG tablet Take 0.1 mg by mouth daily. Not in box of meds and patient is unsure. 04/18/22   [provider]  linaclotide  (LINZESS ) 290 MCG CAPS capsule Take 1 capsule (290 mcg total) by mouth daily before breakfast. 06/14/24 06/14/25  Cindie Carlin POUR, DO  magnesium oxide (MAG-OX) 400 (240 Mg) MG tablet Take 400 mg by mouth 2 (two) times daily.    [provider]  midodrine  (PROAMATINE ) 5 MG tablet Take 1.5 tablets (7.5 mg total) by mouth 3 (three) times daily with meals. 05/08/23   Mallipeddi, Vishnu P, MD  mirabegron  ER (MYRBETRIQ ) 50 MG TB24 tablet Take 1 tablet (50 mg total) by mouth daily. 12/06/21   Watt Rush, MD  potassium chloride (KLOR-CON) 10 MEQ tablet Take 10 mEq by mouth daily.    [provider]  rasagiline (AZILECT) 0.5  MG TABS tablet Take 0.5 mg by mouth daily as needed. 05/22/21   [provider]  rasagiline (AZILECT) 1 MG TABS tablet Take 1 mg by mouth daily. 09/24/21   [provider]  rOPINIRole  (REQUIP ) 3 MG tablet Take by mouth. Patient taking differently: Take 1 mg by mouth 2 (two) times daily. 02/14/21   [provider]  rosuvastatin (CRESTOR) 20 MG tablet Take 20 mg by mouth daily. 10/04/22   [provider]  tizanidine (ZANAFLEX) 2 MG capsule Take 2 mg by mouth 2 (two) times daily.    [provider]    Allergies: Latex, Gabapentin, Prednisone, and Sildenafil citrate    Review of Systems  Constitutional:  Positive for fatigue. Negative for fever.  Respiratory:  Negative for  cough.   Musculoskeletal:  Positive for arthralgias and myalgias.    Updated Vital Signs BP 110/82   Pulse 79   Temp 98.1 F (36.7 C) (Oral)   Resp 18   Ht 1.778 m (5' 10)   Wt 61.2 kg   SpO2 98%   BMI 19.37 kg/m   Physical Exam CONSTITUTIONAL: Elderly, flat affect HEAD: Normocephalic/atraumatic EYES: EOMI/PERRL ENMT: Mucous membranes moist NECK: supple no meningeal signs CV: S1/S2 noted, no murmurs/rubs/gallops noted LUNGS: Lungs are clear to auscultation bilaterally, no apparent distress ABDOMEN: soft, nontender NEURO: Pt is awake/alert/appropriate He can fully range both arms without difficulty He can wiggle his toes and flat both of his feet.  There is no sensory deficit.  Due to pain he has difficulty lifting both legs, but he is able to resist gravity in both legs EXTREMITIES: pulses normal/equal, full ROM Distal pulses equal and intact.  No deformities lower extremities.  The thigh and calves are both soft to touch No erythema or discoloration.  No crepitance SKIN: warm, color normal  (all labs ordered are listed, but only abnormal results are displayed) Labs Reviewed  CBC WITH DIFFERENTIAL/PLATELET - Abnormal; Notable for the following components:      Result Value   RBC 3.40 (*)    Hemoglobin 11.4 (*)    HCT 33.8 (*)    All other components within normal limits  COMPREHENSIVE METABOLIC PANEL WITH GFR - Abnormal; Notable for the following components:   Glucose, Bld 108 (*)    Calcium 8.6 (*)    Total Protein 5.7 (*)    AST 14 (*)    All other components within normal limits  URINALYSIS, ROUTINE W REFLEX MICROSCOPIC - Abnormal; Notable for the following components:   APPearance CLOUDY (*)    Hgb urine dipstick MODERATE (*)    Leukocytes,Ua LARGE (*)    Bacteria, UA RARE (*)    All other components within normal limits  MAGNESIUM  CK    EKG: None  Radiology: No results found.   Procedures   Medications Ordered in the ED   HYDROcodone -acetaminophen  (NORCO/VICODIN) 5-325 MG per tablet 1 tablet (1 tablet Oral Given 11/18/24 0200)  LORazepam  (ATIVAN ) tablet 1 mg (1 mg Oral Given 11/18/24 0251)  fosfomycin (MONUROL) packet 3 g (3 g Oral Given 11/18/24 0330)    Clinical Course as of 11/18/24 0423  Thu Nov 18, 2024  0110 Patient presents with son who admits that he has had a slow progressive decline, and over the past 3 days is having difficulty walking and muscle pain Labs are pending at this time Gilmer Rue phone number- [phone] Wife debbie - [phone] [DW]  [phone] Patient with intermittent cramping, but no distress.  He  is able to ambulate with a slow shuffling gait independently.  Patient found to have UTI, will order antibiotic [DW]  0422 Patient well-appearing.  He appears improved.  He is not septic appearing from his UTI He has generalized weakness but is still able to ambulate. Patient likely having progressive worsening of his Parkinson disease Patient would like to be discharged, and family is amenable to home health No indication for admission at this time No evidence of acute neurologic emergency at this time [DW]    Clinical Course User Index [DW] Midge Golas, MD                                 Medical Decision Making Amount and/or Complexity of Data Reviewed Labs: ordered.  Risk Prescription drug management.        Final diagnoses:  Acute cystitis with hematuria  Parkinsonian tremor [facility])  Myalgia    ED Discharge Orders     None          Midge Golas, MD 11/18/24 0423  "

## 2024-11-18 NOTE — ED Notes (Signed)
 Pt stood with assistance and walked independentlywith a shuffled gait.

## 2024-11-18 NOTE — ED Notes (Signed)
 ED Provider at bedside.

## 2024-11-18 NOTE — ED Notes (Signed)
 Pt asking for pain shot, informed pt DR ordered pain pill and we just gave that to him, it hasn't had time to work yet. Pt given another warm blanket.
